# Patient Record
Sex: Female | Born: 1955 | Hispanic: Yes | Marital: Single | Smoking: Current some day smoker
Health system: Southern US, Community
[De-identification: ages and names within clinical notes are randomized; demographics above are authoritative.]

## PROBLEM LIST (undated history)

## (undated) DIAGNOSIS — R002 Palpitations: Secondary | ICD-10-CM

## (undated) DIAGNOSIS — E785 Hyperlipidemia, unspecified: Secondary | ICD-10-CM

## (undated) HISTORY — DX: Hyperlipidemia, unspecified: E78.5

## (undated) HISTORY — PX: TUBAL LIGATION: SHX77

---

## 2013-08-03 HISTORY — PX: FRACTURE SURGERY: SHX138

## 2013-08-16 ENCOUNTER — Emergency Department (HOSPITAL_COMMUNITY)
Admission: EM | Admit: 2013-08-16 | Discharge: 2013-08-16 | Disposition: A | Discharge status: Home / Self Care | Payer: Self-pay | Attending: Emergency Medicine | Admitting: Emergency Medicine

## 2013-08-16 ENCOUNTER — Encounter (HOSPITAL_COMMUNITY): Payer: Self-pay | Admitting: Emergency Medicine

## 2013-08-16 ENCOUNTER — Emergency Department (HOSPITAL_COMMUNITY): Payer: Self-pay

## 2013-08-16 DIAGNOSIS — W009XXA Unspecified fall due to ice and snow, initial encounter: Secondary | ICD-10-CM

## 2013-08-16 DIAGNOSIS — W010XXA Fall on same level from slipping, tripping and stumbling without subsequent striking against object, initial encounter: Secondary | ICD-10-CM | POA: Insufficient documentation

## 2013-08-16 DIAGNOSIS — Y929 Unspecified place or not applicable: Secondary | ICD-10-CM | POA: Insufficient documentation

## 2013-08-16 DIAGNOSIS — Y939 Activity, unspecified: Secondary | ICD-10-CM | POA: Insufficient documentation

## 2013-08-16 DIAGNOSIS — S52609A Unspecified fracture of lower end of unspecified ulna, initial encounter for closed fracture: Secondary | ICD-10-CM | POA: Insufficient documentation

## 2013-08-16 DIAGNOSIS — S52611A Displaced fracture of right ulna styloid process, initial encounter for closed fracture: Secondary | ICD-10-CM

## 2013-08-16 DIAGNOSIS — F17200 Nicotine dependence, unspecified, uncomplicated: Secondary | ICD-10-CM | POA: Insufficient documentation

## 2013-08-16 DIAGNOSIS — S52309A Unspecified fracture of shaft of unspecified radius, initial encounter for closed fracture: Secondary | ICD-10-CM | POA: Insufficient documentation

## 2013-08-16 DIAGNOSIS — Z79899 Other long term (current) drug therapy: Secondary | ICD-10-CM | POA: Insufficient documentation

## 2013-08-16 DIAGNOSIS — S52351A Displaced comminuted fracture of shaft of radius, right arm, initial encounter for closed fracture: Secondary | ICD-10-CM

## 2013-08-16 MED ORDER — OXYCODONE-ACETAMINOPHEN 5-325 MG PO TABS
2.0000 | ORAL_TABLET | Freq: Once | ORAL | Status: AC
  Administered 2013-08-16: 2 via ORAL
  Filled 2013-08-16: qty 2

## 2013-08-16 MED ORDER — OXYCODONE-ACETAMINOPHEN 5-325 MG PO TABS: 1.0000 | ORAL_TABLET | ORAL | Status: DC | PRN

## 2013-08-16 NOTE — Discharge Instructions (Signed)
Cuidados del yeso o la frula (Cast or Splint Care) El yeso y las frulas sostienen los miembros lesionados y evitan que los huesos se muevan hasta que se curen. Es importante que cuide el yeso o la frula cuando se encuentre en su casa.  INSTRUCCIONES PARA EL CUIDADO EN EL HOGAR  Mantenga el yeso o la frula al descubierto durante el tiempo de secado. Puede tardar Lyndal Pulley 24 y 11 horas para secarse si est hecho de yeso. La fibra de vidrio se seca en menos de 1 hora.  No apoye el yeso sobre nada que sea ms duro que una almohada durante 24 horas.  No aplique peso sobre el miembro lesionado ni haga presin sobre el yeso hasta que el mdico lo autorice.  Mantenga el yeso o la frula secos. Al mojarse pueden perder la forma y podra ocurrir que no soporten el [place]. Un yeso mojado que ha perdido su forma puede presionar de Geographical information systems officer peligrosa en la piel al secarse. Adems, la piel mojada podra infectarse.  Cubra el yeso o la frula con una bolsa plstica cuando tome un bao o cuando salga al exterior en das de lluvia o nieve. Si el yeso est colocado sobre el tronco, deber baarse pasando una esponja por el cuerpo, hasta que se lo retiren.  Si el yeso se moja, squelo con una toalla o con un secador de cabello slo en posicin de aire fro.  Mantenga el yeso o la frula limpios. Si el yeso se ensucia, puede limpiarlo con un pao hmedo.  No coloque objetos extraos duros o blandos debajo del yeso o cabestrillo, como algodn, papel higinico, locin o talco.  No se rasque la piel por debajo del molde con ningn objeto. Podra quedar adherido al yeso. Adems, el rascado puede causar una infeccin. Si siente picazn, use un secador de cabello con aire fro [employer] zona que pica para [employer].  No recorte ni quite el relleno acolchado que se encuentra debajo del yeso.  Ejercite todas las articulaciones que no estn inmovilizadas por el yeso o frula. Por ejemplo, si tiene un yeso  largo de pierna, ejercite la articulacin de la cadera y los dedos de los pies. Si tiene un brazo [employer] o entablillado, ejercite el hombro, el codo, el pulgar y los dedos de la [place].  Eleve el brazo o la pierna sobre 1  2 almohadas durante los primeros 3 das para disminuir la hinchazn y Conservation officer, historic buildings.Es mejor si puede elevar cmodamente el yeso para que quede ms [place] del nivel del corazn. SOLICITE ATENCIN MDICA SI:   El yeso o la frula se quiebran.  Siente que el yeso o la frula estn muy apretados o muy flojos.  Tiene una picazn insoportable debajo del yeso.  El yeso se moja o tiene una zona blanda.  Siente un feo [employer] proviene del interior del [place].  Algn objeto se queda atascado bajo el yeso.  La piel que rodea el yeso enrojece o se vuelve sensible.  Siente un dolor nuevo o el dolor que senta empeora luego de la aplicacin del yeso. SOLICITE ATENCIN MDICA DE INMEDIATO SI:   Observa un lquido que sale por el yeso.  No puede mover el dedo lesionado.  Los dedos le cambian de color (blancos o azules), siente fro, Social research officer, government o por fuera del yeso los dedos estn muy inflamados.  Siente hormigueo o adormecimiento alrededor de la zona de la lesin.  Siente un dolor o presin intensos debajo del yeso.  Presenta dificultad para respirar o Risk manager.  Siente dolor en el pecho. Document Released: 07/20/2005 Document Revised: 05/10/2013 [facility] Patient Information 2014 [place], [place].

## 2013-08-16 NOTE — Progress Notes (Signed)
Orthopedic Tech Progress Note Patient Details:  Allison Baker [birth year] [id number]  Ortho Devices Type of Ortho Device: Ace wrap;Arm sling;Sugartong splint Ortho Device/Splint Location: rue Ortho Device/Splint Interventions: Application   [name] 08/16/2013, 2:43 PM

## 2013-08-16 NOTE — ED Provider Notes (Signed)
Medical screening examination/treatment/procedure(s) were performed by non-physician practitioner and as supervising physician I was immediately available for consultation/collaboration.  EKG Interpretation   None        Merryl Hacker, MD 08/16/13 2003

## 2013-08-16 NOTE — ED Notes (Signed)
[place] this am and hurt rt wrist swollen and painful has good pulse cooler than left can wiggle fingers

## 2013-08-16 NOTE — ED Notes (Signed)
Phone interpretation services called (Spanish) and discharge instructions explained through interpretation as well as medication instructions given and follow up/interpreter stated she understood.

## 2013-08-16 NOTE — ED Provider Notes (Signed)
CSN: [id number]     Arrival date & time 08/16/13  1020 History  This chart was scribed for non-physician practitioner Noland Fordyce, PA-C working with Merryl Hacker, MD by Rolanda Lundborg, ED Scribe. This patient was seen in room TR08C/TR08C and the patient's care was started at 10:28 AM.    Chief Complaint  Patient presents with  . Wrist Pain   The history is provided by the patient and a relative. The history is limited by a language barrier. A language interpreter was used.   HPI Comments: Allison Baker is a 58 y.o. female who presents to the Emergency Department complaining of constant, aching, right wrist pain since slipping and falling on icy stairs and landing on her rand hand. She states she put her hand out to catch herself. She rates the severity of the pain as a 4/10. She denies elbow or shoulder pain. She denies hitting her head or LOC. She is right handed. Pt reports mild numbness and tingling but she is able to move her fingers. She is otherwise healthy. She has no allergies to medications.  History reviewed. No pertinent past medical history. History reviewed. No pertinent past surgical history. No family history on file. History  Substance Use Topics  . Smoking status: Current Every Day Smoker  . Smokeless tobacco: Not on file  . Alcohol Use: No   OB History   Grav Para Term Preterm Abortions TAB SAB Ect Mult Living                 Review of Systems  Musculoskeletal: Positive for arthralgias (right wrist).  Neurological: Positive for numbness. Negative for syncope.  All other systems reviewed and are negative.    Allergies  Review of patient's allergies indicates no known allergies.  Home Medications   Current Outpatient Rx  Name  Route  Sig  Dispense  Refill  . CALCIUM PO   Oral   Take 1 tablet by mouth daily.         . Multiple Vitamin (MULTIVITAMIN WITH MINERALS) TABS tablet   Oral   Take 1 tablet by mouth daily.         Marland Kitchen oxyCODONE-acetaminophen  (PERCOCET/ROXICET) 5-325 MG per tablet   Oral   Take 1-2 tablets by mouth every 4 (four) hours as needed for moderate pain or severe pain.   10 tablet   0    Pulse 64  Temp(Src) 99.2 F (37.3 C)  Resp 16  SpO2 100% Physical Exam  Nursing note and vitals reviewed. Constitutional: She appears well-developed and well-nourished. No distress.  HENT:  Head: Normocephalic and atraumatic.  Eyes: Conjunctivae are normal. No scleral icterus.  Neck: Normal range of motion.  Cardiovascular: Normal rate, regular rhythm and normal heart sounds.   Pulmonary/Chest: Effort normal and breath sounds normal. No respiratory distress. She has no wheezes. She has no rales. She exhibits no tenderness.  Abdominal: Soft.  Musculoskeletal: Normal range of motion.  Right wrist mild to moderate edema obvious deformity, minimal ecchymosis on volar aspect with superficial abrasion. Significant ttp. radial pulse 2+ and cap refill less than 2 seconds. Limited wrist flexion and extension due to pain. 3/5 grip strength.  Neurological: She is alert.  Skin: Skin is warm and dry. She is not diaphoretic.    ED Course  Procedures (including critical care time) Medications - No data to display  DIAGNOSTIC STUDIES: Oxygen Saturation is 100% on RA, normal by my interpretation.    COORDINATION OF CARE: 11:09 AM-  Discussed treatment plan with pt which includes consulting with hand surgeon. Pt agrees to plan.    Labs Review Labs Reviewed - No data to display Imaging Review Dg Forearm Right  08/16/2013   CLINICAL DATA:  58 year old female status post fall with pain. Initial encounter.  EXAM: RIGHT FOREARM - 2 VIEW  COMPARISON:  None.  FINDINGS: Comminuted distal right radius fracture with impaction, dorsal displacement and angulation, and lateral displacement and angulation. Radiocarpal joint involvement. The more proximal right radius appears intact. The right ulna appears intact. Grossly normal alignment at the right  elbow with no evidence of elbow joint effusion.  IMPRESSION: Comminuted, impacted, and displaced distal right radius fracture.   Electronically Signed   By: Lars Pinks M.D.   On: 08/16/2013 10:59   Dg Wrist Complete Right  08/16/2013   CLINICAL DATA:  58 year old female status post fall with pain.  EXAM: RIGHT WRIST - COMPLETE 3+ VIEW  COMPARISON:  Right forearm series from the same day reported separately.  FINDINGS: Comminuted and impacted distal right radius fracture with radiocarpal joint and DRU involvement. Butterfly fragments are dorsally and laterally displaced and impacted. Dorsal angulation.  Minimally displaced ulnar styloid fracture.  Carpal bone alignment within normal limits. Scaphoid intact. Proximal metacarpals intact.  IMPRESSION: Comminuted and impacted distal right radius fracture with radiocarpal joint and DRU involvement. Ulnar styloid fracture.   Electronically Signed   By: Lars Pinks M.D.   On: 08/16/2013 11:01    EKG Interpretation   None       MDM   1. Fall due to ice or snow   2. Closed displaced comminuted fracture of shaft of right radius   3. Fracture of right ulnar styloid    Pt c/o sudden onset right wrist pain after fall on steps this morning. Obvious deformity.  Pt declined pain medication in ED.    IMPRESSION: Comminuted, impacted, and displaced distal right radius fracture.   Consulted with Dr. Caralyn Guile, will splint in long arm sugar tong splint and place in sling. Pt is to f/u with Dr. Caralyn Guile in the office on Friday, 08/19/15. Rx: percocet.  Return precautions provided. Pt verbalized understanding and agreement with tx plan.  I personally performed the services described in this documentation, which was scribed in my presence. The recorded information has been reviewed and is accurate.   Noland Fordyce, PA-C 08/16/13 1318

## 2013-08-22 ENCOUNTER — Encounter (HOSPITAL_COMMUNITY)
Admission: RE | Admit: 2013-08-22 | Discharge: 2013-08-22 | Disposition: A | Discharge status: Home / Self Care | Payer: Self-pay | Source: Ambulatory Visit | Attending: Orthopedic Surgery | Admitting: Orthopedic Surgery

## 2013-08-22 ENCOUNTER — Encounter (HOSPITAL_COMMUNITY): Payer: Self-pay

## 2013-08-22 DIAGNOSIS — Z01812 Encounter for preprocedural laboratory examination: Secondary | ICD-10-CM | POA: Insufficient documentation

## 2013-08-22 DIAGNOSIS — Z01818 Encounter for other preprocedural examination: Secondary | ICD-10-CM | POA: Insufficient documentation

## 2013-08-22 DIAGNOSIS — Z01810 Encounter for preprocedural cardiovascular examination: Secondary | ICD-10-CM | POA: Insufficient documentation

## 2013-08-22 HISTORY — DX: Palpitations: R00.2

## 2013-08-22 LAB — BASIC METABOLIC PANEL
BUN: 13 mg/dL (ref 6–23)
CO2: 24 mEq/L (ref 19–32)
Calcium: 9.3 mg/dL (ref 8.4–10.5)
Chloride: 103 mEq/L (ref 96–112)
Creatinine, Ser: 0.68 mg/dL (ref 0.50–1.10)
GFR calc Af Amer: >90 mL/min (ref >90)
GFR calc non Af Amer: >90 mL/min (ref >90)
Glucose, Bld: 79 mg/dL (ref 70–99)
Potassium: 4.2 mEq/L (ref 3.7–5.3)
Sodium: 141 mEq/L (ref 137–147)

## 2013-08-22 LAB — CBC
HCT: 37.3 % (ref 36.0–46.0)
Hemoglobin: 12.6 g/dL (ref 12.0–15.0)
MCH: 31 pg (ref 26.0–34.0)
MCHC: 33.8 g/dL (ref 30.0–36.0)
MCV: 91.9 fL (ref 78.0–100.0)
Platelets: 328 10*3/uL (ref 150–400)
RBC: 4.06 MIL/uL (ref 3.87–5.11)
RDW: 13 % (ref 11.5–15.5)
WBC: 8.9 10*3/uL (ref 4.0–10.5)

## 2013-08-22 NOTE — Progress Notes (Signed)
Denies having a PCP Denies seeing a cardiologist. Denies having a recent CXR or EKG. Denies having an echo, stress test, or card cath. Reports some palpations when her father was sick, no problems since. .  EKG ordered to assess.

## 2013-08-22 NOTE — Pre-Procedure Instructions (Signed)
Josefita Weissmann  08/22/2013   Your procedure is scheduled on:  Jan 22 at 330pm  Report to [facility]  2 * 3 at 130pm  Call this number if you have problems the morning of surgery: [phone]   Remember:   Do not eat food or drink liquids after midnight.   Take these medicines the morning of surgery with A SIP OF WATER: Pain pill if needed Percocet (Oxycodone)  Stop taking Aspirin, Aleve, Ibuprofen, BC's, Goody's, Herbal medications, and Fish Oil   Do not wear jewelry, make-up or nail polish.  Do not wear lotions, powders, or perfumes. You may wear deodorant.  Do not shave 48 hours prior to surgery.   Do not bring valuables to the hospital.  [facility] is not responsible                  for any belongings or valuables.               Contacts, dentures or bridgework may not be worn into surgery.  Leave suitcase in the car. After surgery it may be brought to your room.  For patients admitted to the hospital, discharge time is determined by your                treatment team.               Patients discharged the day of surgery will not be allowed to drive  home.    Special Instructions: Shower using CHG 2 nights before surgery and the night before surgery.  If you shower the day of surgery use CHG.  Use special wash - you have one bottle of CHG for all showers.  You should use approximately 1/3 of the bottle for each shower.   Please read over the following fact sheets that you were given: Pain Booklet, Coughing and Deep Breathing and Surgical Site Infection Prevention

## 2013-08-22 NOTE — Progress Notes (Signed)
Spoke with Baker Janus, about pt being spanish speaking.  Baker Janus will set up interpretor for the DOS.

## 2013-08-23 NOTE — H&P (Signed)
Allison Baker is an 58 y.o. female.   Chief Complaint: RIGHT WRIST PAIN HPI: PT WITH FALL ON RIGHT WRIST PT FOLLOWED IN OFFICE PT HERE FOR SURGERY TO CORRECT POSITION OF RIGHT WRIST PT WITH NO PRIOR SURGERY  Past Medical History  Diagnosis Date  . Palpitations     Past Surgical History  Procedure Laterality Date  . Tubal ligation      No family history on file. Social History:  reports that she has been smoking.  She does not have any smokeless tobacco history on file. She reports that she does not drink alcohol. Her drug history is not on file.  Allergies: No Known Allergies  No prescriptions prior to admission    Results for orders placed during the hospital encounter of 08/22/13 (from the past 48 hour(s))  BASIC METABOLIC PANEL     Status: None   Collection Time    08/22/13 12:44 PM      Result Value Range   Sodium 141  137 - 147 mEq/L   Potassium 4.2  3.7 - 5.3 mEq/L   Chloride 103  96 - 112 mEq/L   CO2 24  19 - 32 mEq/L   Glucose, Bld 79  70 - 99 mg/dL   BUN 13  6 - 23 mg/dL   Creatinine, Ser 0.68  0.50 - 1.10 mg/dL   Calcium 9.3  8.4 - 10.5 mg/dL   GFR calc non Af Amer >90  >90 mL/min   GFR calc Af Amer >90  >90 mL/min   Comment: (NOTE)     The eGFR has been calculated using the CKD EPI equation.     This calculation has not been validated in all clinical situations.     eGFR's persistently <90 mL/min signify possible Chronic Kidney     Disease.  CBC     Status: None   Collection Time    08/22/13 12:44 PM      Result Value Range   WBC 8.9  4.0 - 10.5 K/uL   RBC 4.06  3.87 - 5.11 MIL/uL   Hemoglobin 12.6  12.0 - 15.0 g/dL   HCT 37.3  36.0 - 46.0 %   MCV 91.9  78.0 - 100.0 fL   MCH 31.0  26.0 - 34.0 pg   MCHC 33.8  30.0 - 36.0 g/dL   RDW 13.0  11.5 - 15.5 %   Platelets 328  150 - 400 K/uL   No results found.  ROS NO RECENT ILLNESSES OR HOSPITALIZATIONS  There were no vitals taken for this visit. Physical Exam   General Appearance:  Alert, cooperative,  no distress, appears stated age  Head:  Normocephalic, without obvious abnormality, atraumatic  Eyes:  Pupils equal, conjunctiva/corneas clear,         Throat: Lips, mucosa, and tongue normal; teeth and gums normal  Neck: No visible masses     Lungs:   respirations unlabored  Chest Wall:  No tenderness or deformity  Heart:  Regular rate and rhythm,  Abdomen:   Soft, non-tender,         Extremities: RIGHT WRIST: SPLINT CLEAN AND DRY WIGGLES FINGERS FINGERS WARM WELL PERFUSED LIMITED FOREARM MOBILITY ABLE TO EXTEND THUMB AND FINGERS  Pulses: 2+ and symmetric  Skin: Skin color, texture, turgor normal, no rashes or lesions     Neurologic: Normal    Assessment/Plan RIGHT WRIST COMMINUTED DISTAL RADIUS FRACTURE, INTRAARTICULAR DISPLACED  RIGHT WRIST OPEN REDUCTION AND INTERNAL FIXATION AND REPAIR AS INDICATED  R/B/A  DISCUSSED WITH PT IN OFFICE.  PT VOICED UNDERSTANDING OF PLAN CONSENT SIGNED DAY OF SURGERY PT SEEN AND EXAMINED PRIOR TO OPERATIVE PROCEDURE/DAY OF SURGERY SITE MARKED. QUESTIONS ANSWERED WILL REMAIN OVERNIGHT OBSERVATION FOLLOWING SURGERY  Linna Hoff 08/24/2013 AT 9340

## 2013-08-23 NOTE — Progress Notes (Signed)
No orders in epic, called Dr.Ortmann's office to inform them.

## 2013-08-24 ENCOUNTER — Encounter (HOSPITAL_COMMUNITY)
Admission: RE | Disposition: A | Discharge status: Home / Self Care | Payer: Self-pay | Source: Ambulatory Visit | Attending: Orthopedic Surgery

## 2013-08-24 ENCOUNTER — Ambulatory Visit (HOSPITAL_COMMUNITY): Payer: Self-pay | Admitting: Anesthesiology

## 2013-08-24 ENCOUNTER — Encounter (HOSPITAL_COMMUNITY): Payer: Self-pay | Admitting: Anesthesiology

## 2013-08-24 ENCOUNTER — Ambulatory Visit (HOSPITAL_COMMUNITY)
Admission: RE | Admit: 2013-08-24 | Discharge: 2013-08-24 | Disposition: A | Discharge status: Home / Self Care | Payer: Self-pay | Source: Ambulatory Visit | Attending: Orthopedic Surgery | Admitting: Orthopedic Surgery

## 2013-08-24 DIAGNOSIS — I209 Angina pectoris, unspecified: Secondary | ICD-10-CM | POA: Insufficient documentation

## 2013-08-24 DIAGNOSIS — I2510 Atherosclerotic heart disease of native coronary artery without angina pectoris: Secondary | ICD-10-CM | POA: Insufficient documentation

## 2013-08-24 DIAGNOSIS — S52539A Colles' fracture of unspecified radius, initial encounter for closed fracture: Secondary | ICD-10-CM | POA: Insufficient documentation

## 2013-08-24 DIAGNOSIS — F17200 Nicotine dependence, unspecified, uncomplicated: Secondary | ICD-10-CM | POA: Insufficient documentation

## 2013-08-24 DIAGNOSIS — R002 Palpitations: Secondary | ICD-10-CM | POA: Insufficient documentation

## 2013-08-24 DIAGNOSIS — W19XXXA Unspecified fall, initial encounter: Secondary | ICD-10-CM | POA: Insufficient documentation

## 2013-08-24 HISTORY — PX: ORIF WRIST FRACTURE: SHX2133

## 2013-08-24 SURGERY — OPEN REDUCTION INTERNAL FIXATION (ORIF) WRIST FRACTURE
Anesthesia: Regional | Site: Wrist | Laterality: Right

## 2013-08-24 MED ORDER — OXYCODONE HCL 5 MG PO TABS: 5.0000 mg | ORAL_TABLET | Freq: Once | ORAL | Status: DC | PRN

## 2013-08-24 MED ORDER — DOCUSATE SODIUM 100 MG PO CAPS: 100.0000 mg | ORAL_CAPSULE | Freq: Two times a day (BID) | ORAL | Status: DC

## 2013-08-24 MED ORDER — FENTANYL CITRATE 0.05 MG/ML IJ SOLN
INTRAMUSCULAR | Status: AC
  Filled 2013-08-24: qty 5

## 2013-08-24 MED ORDER — CEFAZOLIN SODIUM-DEXTROSE 2-3 GM-% IV SOLR
INTRAVENOUS | Status: AC
  Administered 2013-08-24: 2 g via INTRAVENOUS
  Filled 2013-08-24: qty 50

## 2013-08-24 MED ORDER — MIDAZOLAM HCL 2 MG/2ML IJ SOLN
INTRAMUSCULAR | Status: AC
  Administered 2013-08-24: 2 mg
  Filled 2013-08-24: qty 2

## 2013-08-24 MED ORDER — MIDAZOLAM HCL 2 MG/2ML IJ SOLN
INTRAMUSCULAR | Status: AC
  Filled 2013-08-24: qty 2

## 2013-08-24 MED ORDER — OXYCODONE-ACETAMINOPHEN 10-325 MG PO TABS
1.0000 | ORAL_TABLET | ORAL | Status: DC | PRN
Start: 2013-08-24 — End: 2015-10-17

## 2013-08-24 MED ORDER — ACETAMINOPHEN 160 MG/5ML PO SOLN: 325.0000 mg | ORAL | Status: DC | PRN

## 2013-08-24 MED ORDER — OXYCODONE HCL 5 MG/5ML PO SOLN: 5.0000 mg | Freq: Once | ORAL | Status: DC | PRN

## 2013-08-24 MED ORDER — 0.9 % SODIUM CHLORIDE (POUR BTL) OPTIME
TOPICAL | Status: DC | PRN
  Administered 2013-08-24: 1000 mL

## 2013-08-24 MED ORDER — PROPOFOL 10 MG/ML IV BOLUS
INTRAVENOUS | Status: AC
  Filled 2013-08-24: qty 20

## 2013-08-24 MED ORDER — ONDANSETRON HCL 4 MG/2ML IJ SOLN: 4.0000 mg | Freq: Once | INTRAMUSCULAR | Status: DC | PRN

## 2013-08-24 MED ORDER — LACTATED RINGERS IV SOLN
INTRAVENOUS | Status: DC
  Administered 2013-08-24: 15:00:00 via INTRAVENOUS

## 2013-08-24 MED ORDER — CHLORHEXIDINE GLUCONATE 4 % EX LIQD: 60.0000 mL | Freq: Once | CUTANEOUS | Status: DC

## 2013-08-24 MED ORDER — LACTATED RINGERS IV SOLN
INTRAVENOUS | Status: DC | PRN
  Administered 2013-08-24: 15:00:00 via INTRAVENOUS

## 2013-08-24 MED ORDER — LIDOCAINE HCL (CARDIAC) 20 MG/ML IV SOLN
INTRAVENOUS | Status: AC
  Filled 2013-08-24: qty 5

## 2013-08-24 MED ORDER — FENTANYL CITRATE 0.05 MG/ML IJ SOLN
INTRAMUSCULAR | Status: DC | PRN
  Administered 2013-08-24: 25 ug via INTRAVENOUS
  Administered 2013-08-24 (×3): 50 ug via INTRAVENOUS
  Administered 2013-08-24: 25 ug via INTRAVENOUS
  Administered 2013-08-24: 50 ug via INTRAVENOUS

## 2013-08-24 MED ORDER — VITAMIN C 500 MG PO TABS: 500.0000 mg | ORAL_TABLET | Freq: Every day | ORAL | Status: DC

## 2013-08-24 MED ORDER — HYDROMORPHONE HCL PF 1 MG/ML IJ SOLN: 0.2500 mg | INTRAMUSCULAR | Status: DC | PRN

## 2013-08-24 MED ORDER — LIDOCAINE HCL (CARDIAC) 20 MG/ML IV SOLN
INTRAVENOUS | Status: DC | PRN
  Administered 2013-08-24: 30 mg via INTRAVENOUS

## 2013-08-24 MED ORDER — ROCURONIUM BROMIDE 50 MG/5ML IV SOLN
INTRAVENOUS | Status: AC
  Filled 2013-08-24: qty 1

## 2013-08-24 MED ORDER — ROPIVACAINE HCL 5 MG/ML IJ SOLN
INTRAMUSCULAR | Status: DC | PRN
  Administered 2013-08-24: 20 mL via PERINEURAL

## 2013-08-24 MED ORDER — PROPOFOL 10 MG/ML IV BOLUS
INTRAVENOUS | Status: DC | PRN
  Administered 2013-08-24: 20 mg via INTRAVENOUS
  Administered 2013-08-24 (×2): 30 mg via INTRAVENOUS
  Administered 2013-08-24: 20 mg via INTRAVENOUS
  Administered 2013-08-24: 30 mg via INTRAVENOUS

## 2013-08-24 MED ORDER — CEFAZOLIN SODIUM-DEXTROSE 2-3 GM-% IV SOLR: 2.0000 g | INTRAVENOUS | Status: DC

## 2013-08-24 MED ORDER — MIDAZOLAM HCL 5 MG/5ML IJ SOLN
INTRAMUSCULAR | Status: DC | PRN
  Administered 2013-08-24 (×2): 1 mg via INTRAVENOUS

## 2013-08-24 MED ORDER — BUPIVACAINE HCL (PF) 0.25 % IJ SOLN
INTRAMUSCULAR | Status: AC
  Filled 2013-08-24: qty 30

## 2013-08-24 MED ORDER — FENTANYL CITRATE 0.05 MG/ML IJ SOLN
INTRAMUSCULAR | Status: AC
  Filled 2013-08-24: qty 2

## 2013-08-24 MED ORDER — ACETAMINOPHEN 325 MG PO TABS: 325.0000 mg | ORAL_TABLET | ORAL | Status: DC | PRN

## 2013-08-24 MED ORDER — METHOCARBAMOL 500 MG PO TABS: 500.0000 mg | ORAL_TABLET | Freq: Four times a day (QID) | ORAL | Status: DC

## 2013-08-24 SURGICAL SUPPLY — 54 items
BANDAGE ELASTIC 3 VELCRO ST LF (GAUZE/BANDAGES/DRESSINGS) ×2 IMPLANT
BANDAGE ELASTIC 4 VELCRO ST LF (GAUZE/BANDAGES/DRESSINGS) ×2 IMPLANT
BANDAGE GAUZE ELAST BULKY 4 IN (GAUZE/BANDAGES/DRESSINGS) IMPLANT
BIT DRILL 2.2 SS TIBIAL (BIT) ×2 IMPLANT
BLADE SURG ROTATE 9660 (MISCELLANEOUS) IMPLANT
BNDG ESMARK 4X9 LF (GAUZE/BANDAGES/DRESSINGS) ×2 IMPLANT
BNDG GAUZE ELAST 4 BULKY (GAUZE/BANDAGES/DRESSINGS) ×2 IMPLANT
CLOTH BEACON ORANGE TIMEOUT ST (SAFETY) IMPLANT
CORDS BIPOLAR (ELECTRODE) ×2 IMPLANT
COVER SURGICAL LIGHT HANDLE (MISCELLANEOUS) ×2 IMPLANT
CUFF TOURNIQUET SINGLE 18IN (TOURNIQUET CUFF) ×2 IMPLANT
CUFF TOURNIQUET SINGLE 24IN (TOURNIQUET CUFF) IMPLANT
DRAIN TLS ROUND 10FR (DRAIN) IMPLANT
DRAPE OEC MINIVIEW 54X84 (DRAPES) ×2 IMPLANT
DRAPE SURG 17X11 SM STRL (DRAPES) ×2 IMPLANT
DRSG ADAPTIC 3X8 NADH LF (GAUZE/BANDAGES/DRESSINGS) ×2 IMPLANT
ELECT REM PT RETURN 9FT ADLT (ELECTROSURGICAL)
ELECTRODE REM PT RTRN 9FT ADLT (ELECTROSURGICAL) IMPLANT
GLOVE BIOGEL PI IND STRL 8.5 (GLOVE) ×1 IMPLANT
GLOVE BIOGEL PI INDICATOR 8.5 (GLOVE) ×1
GLOVE SURG ORTHO 8.0 STRL STRW (GLOVE) ×2 IMPLANT
GOWN PREVENTION PLUS XLARGE (GOWN DISPOSABLE) ×2 IMPLANT
GOWN STRL NON-REIN LRG LVL3 (GOWN DISPOSABLE) ×2 IMPLANT
KIT BASIN OR (CUSTOM PROCEDURE TRAY) ×2 IMPLANT
KIT ROOM TURNOVER OR (KITS) ×2 IMPLANT
MANIFOLD NEPTUNE II (INSTRUMENTS) IMPLANT
NEEDLE HYPO 25X1 1.5 SAFETY (NEEDLE) ×2 IMPLANT
NS IRRIG 1000ML POUR BTL (IV SOLUTION) ×2 IMPLANT
PACK ORTHO EXTREMITY (CUSTOM PROCEDURE TRAY) ×2 IMPLANT
PAD ARMBOARD 7.5X6 YLW CONV (MISCELLANEOUS) ×4 IMPLANT
PAD CAST 4YDX4 CTTN HI CHSV (CAST SUPPLIES) ×1 IMPLANT
PADDING CAST COTTON 4X4 STRL (CAST SUPPLIES) ×1
PEG LOCKING SMOOTH 2.2X18 (Peg) ×8 IMPLANT
PEG LOCKING SMOOTH 2.2X20 (Screw) ×2 IMPLANT
PLATE NARROW DVR RIGHT (Plate) ×2 IMPLANT
PUTTY DBM STAGRAFT PLUS 5CC (Putty) ×2 IMPLANT
SCREW LOCK 14X2.7X 3 LD TPR (Screw) ×4 IMPLANT
SCREW LOCK 18X2.7X 3 LD TPR (Screw) ×1 IMPLANT
SCREW LOCKING 2.7X14 (Screw) ×4 IMPLANT
SCREW LOCKING 2.7X15MM (Screw) ×2 IMPLANT
SCREW LOCKING 2.7X18 (Screw) ×1 IMPLANT
SOAP 2 % CHG 4 OZ (WOUND CARE) ×2 IMPLANT
SPLINT FIBERGLASS 4X30 (CAST SUPPLIES) ×2 IMPLANT
SPONGE GAUZE 4X4 12PLY (GAUZE/BANDAGES/DRESSINGS) ×2 IMPLANT
STRIP CLOSURE SKIN 1/2X4 (GAUZE/BANDAGES/DRESSINGS) ×2 IMPLANT
SUT PROLENE 4 0 PS 2 18 (SUTURE) ×2 IMPLANT
SUT VIC AB 2-0 FS1 27 (SUTURE) ×2 IMPLANT
SUT VICRYL 4-0 PS2 18IN ABS (SUTURE) ×4 IMPLANT
SYR CONTROL 10ML LL (SYRINGE) IMPLANT
SYSTEM CHEST DRAIN TLS 7FR (DRAIN) IMPLANT
TOWEL OR 17X24 6PK STRL BLUE (TOWEL DISPOSABLE) ×2 IMPLANT
TOWEL OR 17X26 10 PK STRL BLUE (TOWEL DISPOSABLE) ×2 IMPLANT
TUBE CONNECTING 12X1/4 (SUCTIONS) ×2 IMPLANT
WATER STERILE IRR 1000ML POUR (IV SOLUTION) IMPLANT

## 2013-08-24 NOTE — Progress Notes (Signed)
Orthopedic Tech Progress Note Patient Details:  Allison Baker 1956/03/28 [id number]  Ortho Devices Type of Ortho Device: Arm sling Ortho Device/Splint Location: RUE Ortho Device/Splint Interventions: Ordered;Application   Braulio Bosch 08/24/2013, 5:14 PM

## 2013-08-24 NOTE — Progress Notes (Signed)
Interpreter at bedside.

## 2013-08-24 NOTE — Anesthesia Procedure Notes (Signed)
Anesthesia Regional Block:  Supraclavicular block  Pre-Anesthetic Checklist: ,, timeout performed, Correct Patient, Correct Site, Correct Laterality, Correct Procedure, Correct Position, site marked, Risks and benefits discussed,  Surgical consent,  Pre-op evaluation,  At surgeon's request and post-op pain management  Laterality: Right  Prep: chloraprep       Needles:  Injection technique: Single-shot  Needle Type: Stimiplex          Additional Needles:  Procedures: ultrasound guided (picture in chart) Supraclavicular block Narrative:  Start time: 08/24/2013 2:51 PM End time: 08/24/2013 2:58 PM Injection made incrementally with aspirations every 5 mL.  Performed by: Personally  Anesthesiologist: Ermalene Postin

## 2013-08-24 NOTE — Brief Op Note (Signed)
08/24/2013  2:43 PM  PATIENT:  Allison Baker  58 y.o. female  PRE-OPERATIVE DIAGNOSIS:  Right Wrist Distal Radius Fracture  POST-OPERATIVE DIAGNOSIS:  same  PROCEDURE:  Procedure(s): OPEN REDUCTION INTERNAL FIXATION (ORIF) RIGHT WRIST FRACTURE (Right)  SURGEON:  Surgeon(s) and Role:    * Linna Hoff, MD - Primary  PHYSICIAN ASSISTANT:   ASSISTANTS: none   ANESTHESIA:   general  EBL:     BLOOD ADMINISTERED:none  DRAINS: none   LOCAL MEDICATIONS USED:  MARCAINE     SPECIMEN:  No Specimen  DISPOSITION OF SPECIMEN:  N/A  COUNTS:  YES  TOURNIQUET:    DICTATION: [id number]  PLAN OF CARE: discharge to home  PATIENT DISPOSITION:  PACU - hemodynamically stable.   Delay start of Pharmacological VTE agent (>24hrs) due to surgical blood loss or risk of bleeding: not applicable

## 2013-08-24 NOTE — Transfer of Care (Signed)
Immediate Anesthesia Transfer of Care Note  Patient: Allison Baker  Procedure(s) Performed: Procedure(s): OPEN REDUCTION INTERNAL FIXATION (ORIF) RIGHT WRIST FRACTURE (Right)  Patient Location: PACU  Anesthesia Type:MAC and MAC combined with regional for post-op pain  Level of Consciousness: awake, alert  and oriented  Airway & Oxygen Therapy: Patient Spontanous Breathing  Post-op Assessment: Report given to PACU RN and Post -op Vital signs reviewed and stable  Post vital signs: Reviewed and stable  Complications: No apparent anesthesia complications

## 2013-08-24 NOTE — Discharge Instructions (Signed)
KEEP BANDAGE CLEAN AND DRY CALL OFFICE FOR F/U APPT [phone] IN 14 DAYS KEEP HAND ELEVATED ABOVE HEART OK TO APPLY ICE TO OPERATIVE AREA CONTACT OFFICE IF ANY WORSENING PAIN OR CONCERNS. 

## 2013-08-24 NOTE — Anesthesia Preprocedure Evaluation (Addendum)
Anesthesia Evaluation  Patient identified by MRN, date of birth, ID band Patient awake    Reviewed: Allergy & Precautions, H&P , NPO status , Patient's Chart, lab work & pertinent test results  History of Anesthesia Complications Negative for: history of anesthetic complications  Airway Mallampati: II TM Distance: >3 FB Neck ROM: Full    Dental  (+) Teeth Intact and Dental Advisory Given   Pulmonary neg shortness of breath, neg COPDCurrent Smoker,    Pulmonary exam normal       Cardiovascular Exercise Tolerance: Good METS: 5 - 7 Mets - angina- CAD and - DOE negative cardio ROS  Rhythm:Regular Rate:Normal     Neuro/Psych negative neurological ROS     GI/Hepatic negative GI ROS, Neg liver ROS,   Endo/Other  negative endocrine ROS  Renal/GU negative Renal ROS  negative genitourinary   Musculoskeletal   Abdominal   Peds  Hematology negative hematology ROS (+)   Anesthesia Other Findings   Reproductive/Obstetrics                          Anesthesia Physical Anesthesia Plan  ASA: II  Anesthesia Plan: Regional   Post-op Pain Management: MAC Combined w/ Regional for Post-op pain   Induction: Intravenous  Airway Management Planned: Mask  Additional Equipment: None  Intra-op Plan:   Post-operative Plan: Extubation in OR  Informed Consent: I have reviewed the patients History and Physical, chart, labs and discussed the procedure including the risks, benefits and alternatives for the proposed anesthesia with the patient or authorized representative who has indicated his/her understanding and acceptance.   Dental advisory given  Plan Discussed with: CRNA, Surgeon and Anesthesiologist  Anesthesia Plan Comments:        Anesthesia Quick Evaluation

## 2013-08-25 NOTE — Anesthesia Postprocedure Evaluation (Signed)
  Anesthesia Post-op Note  Patient: Allison Baker  Procedure(s) Performed: Procedure(s): OPEN REDUCTION INTERNAL FIXATION (ORIF) RIGHT WRIST FRACTURE (Right)  Patient Location: PACU  Anesthesia Type:MAC and Regional  Level of Consciousness: awake, alert  and oriented  Airway and Oxygen Therapy: Patient Spontanous Breathing  Post-op Pain: none  Post-op Assessment: Post-op Vital signs reviewed, Patient's Cardiovascular Status Stable, Respiratory Function Stable, Patent Airway, No signs of Nausea or vomiting and Pain level controlled  Post-op Vital Signs: Reviewed and stable  Complications: No apparent anesthesia complications

## 2013-08-25 NOTE — Op Note (Signed)
NAMEADLYN, Allison Baker                  ACCOUNT NO.:  [id number]  MEDICAL RECORD NO.:  [id number]  LOCATION:  [facility]                         FACILITY:  [facility]  PHYSICIAN:  Melrose Nakayama, MD  DATE OF BIRTH:  Dec 26, 1955  DATE OF PROCEDURE:  08/24/2013 DATE OF DISCHARGE:  08/24/2013                              OPERATIVE REPORT   PREOPERATIVE DIAGNOSIS:  Right wrist intra-articular distal radius fracture 3 or more fragments.  POSTOPERATIVE DIAGNOSIS:  Right wrist intra-articular distal radius fracture 3 or more fragments.  ATTENDING PHYSICIAN:  Linna Hoff IV, MD, who was scrubbed and present for the entire procedure.  ASSISTANT SURGEON:  None.  ANESTHESIA:  Supraclavicular block performed by anesthesia and IV sedation.  SURGICAL PROCEDURE: 1. Open treatment of right wrist intra-articular distal radius     fracture 3 or more fragments. 2. Right wrist brachioradialis tendon and tenotomy and lengthening. 3. Right wrist radiographs 3 views.  SURGICAL INDICATIONS:  Allison Baker is a 58 year old right-hand-dominant female, who slipped and fell last week sustaining a highly comminuted intra-articular distal radius fracture.  The patient was seen and evaluated in the office and given the degree of displacement, recommended to undergo the above procedure.  Risks, benefits, and alternatives were discussed in detail with the patient and signed informed consent was obtained.  Risks include, but not limited to bleeding, infection, damage to nearby nerves, arteries, or tendons, loss of motion wrist and digits, incomplete relief of symptoms, and need for further surgical intervention.  DESCRIPTION OF PROCEDURE:  The patient was properly identified in the preoperative holding area and marked with a permanent marker made on the right wrist to indicate the correct operative site.  The patient was then brought back to the operating room, placed supine on the anesthesia room table.   Supraclavicular block had been performed.  Right upper extremity, a well-padded tourniquet was placed on the right brachium sealed with 1000 drape.  The right upper extremity was then prepped and draped in normal sterile fashion.  Time-out was called.  Correct site was identified, and the procedure was then begun.  Attention was then turned to the right wrist where the limb was then elevated using Esmarch exsanguination and tourniquet insufflated.  On the limb, the FCR sheath was then opened proximally and distally.  Going through the floor of the FCR sheath, the FPL __________ and the pronator quadratus was elevated in an L-shaped fashion.  The patient did have a highly comminuted intra- articular fracture 3 or more fragments.  An open reduction was then performed.  Several mL of __________ bone graft substitute were then packed in the metaphyseal defect helping out the radial column. Brachioradialis was carefully released off the first and protection of the first dorsal compartment tendons in order to reduce the __________ the majority of the fracture and comminution of several fragments, 3 more fragments within the radial column alone.  An open reduction was then performed after the bone graft was then used for the metaphyseal defect.  The volar plate was then applied.  Oblong screw hole was then used with the appropriate drill bit and depth gauge measurement.  Plate height was then  adjusted using the mini C-arm.  Following this, distal fixation was then carried out from an ulnar to radial direction with the combination of locking pegs and then locking screws in the radial column.  Copious wound irrigation done throughout.  Following this, the final fixation was then carried out proximally with combination of locking and nonlocking screws.  Once the intra-articular fracture was then restored back to the near anatomical alignment, the final radiographs were then obtained.  The pronator  quadratus was then closed with 2-0 Vicryl and the subcutaneous tissues closed with 4-0 Vicryl and the skin closed with 4-0 Vicryl Rapide.  Steri-Strips were applied.  The tourniquet had been deflated.  Hemostasis was obtained.  Sterile compressive bandage was applied.  The patient was then placed in a well- padded sugar-tong splint, taken to the recovery room in good condition.  POSTPROCEDURE PLAN:  Intraoperative radiographs 3 views of the wrist did show the internal fixation in place.  There was good alignment of both planes.  The patient was discharged to home, given a good block, oral pain medications, antibiotics and seen back in the office in approximately 2 weeks for wound check, and x-rays out of the cast, out of the splint and a short-arm cast for total of 2 weeks, and then begin an outpatient therapy regimen at the 4 week mark.  Radiographs at each visit.     Melrose Nakayama, MD     FWO/MEDQ  D:  08/24/2013  T:  08/25/2013  Job:  [id number]

## 2013-08-28 ENCOUNTER — Encounter (HOSPITAL_COMMUNITY): Payer: Self-pay | Admitting: Orthopedic Surgery

## 2015-01-11 IMAGING — CR DG FOREARM 2V*R*
2 series · 2 of 2 positions shown · non-contrast
Comparison: None.

CLINICAL DATA: 57-year-old female status post fall with pain.
Initial encounter.

EXAM:
RIGHT FOREARM - 2 VIEW

[x forearm ap right]
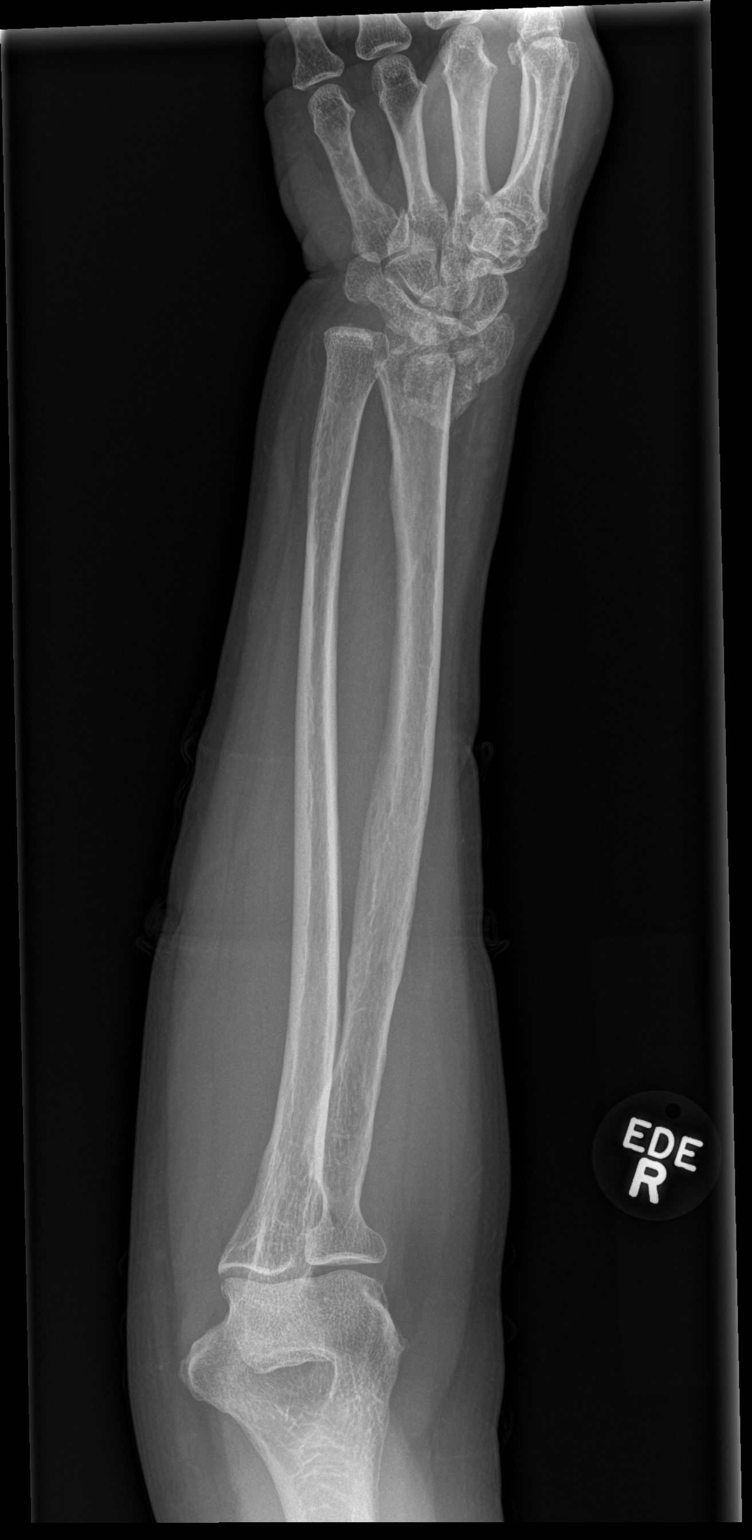

[x forearm lat right]
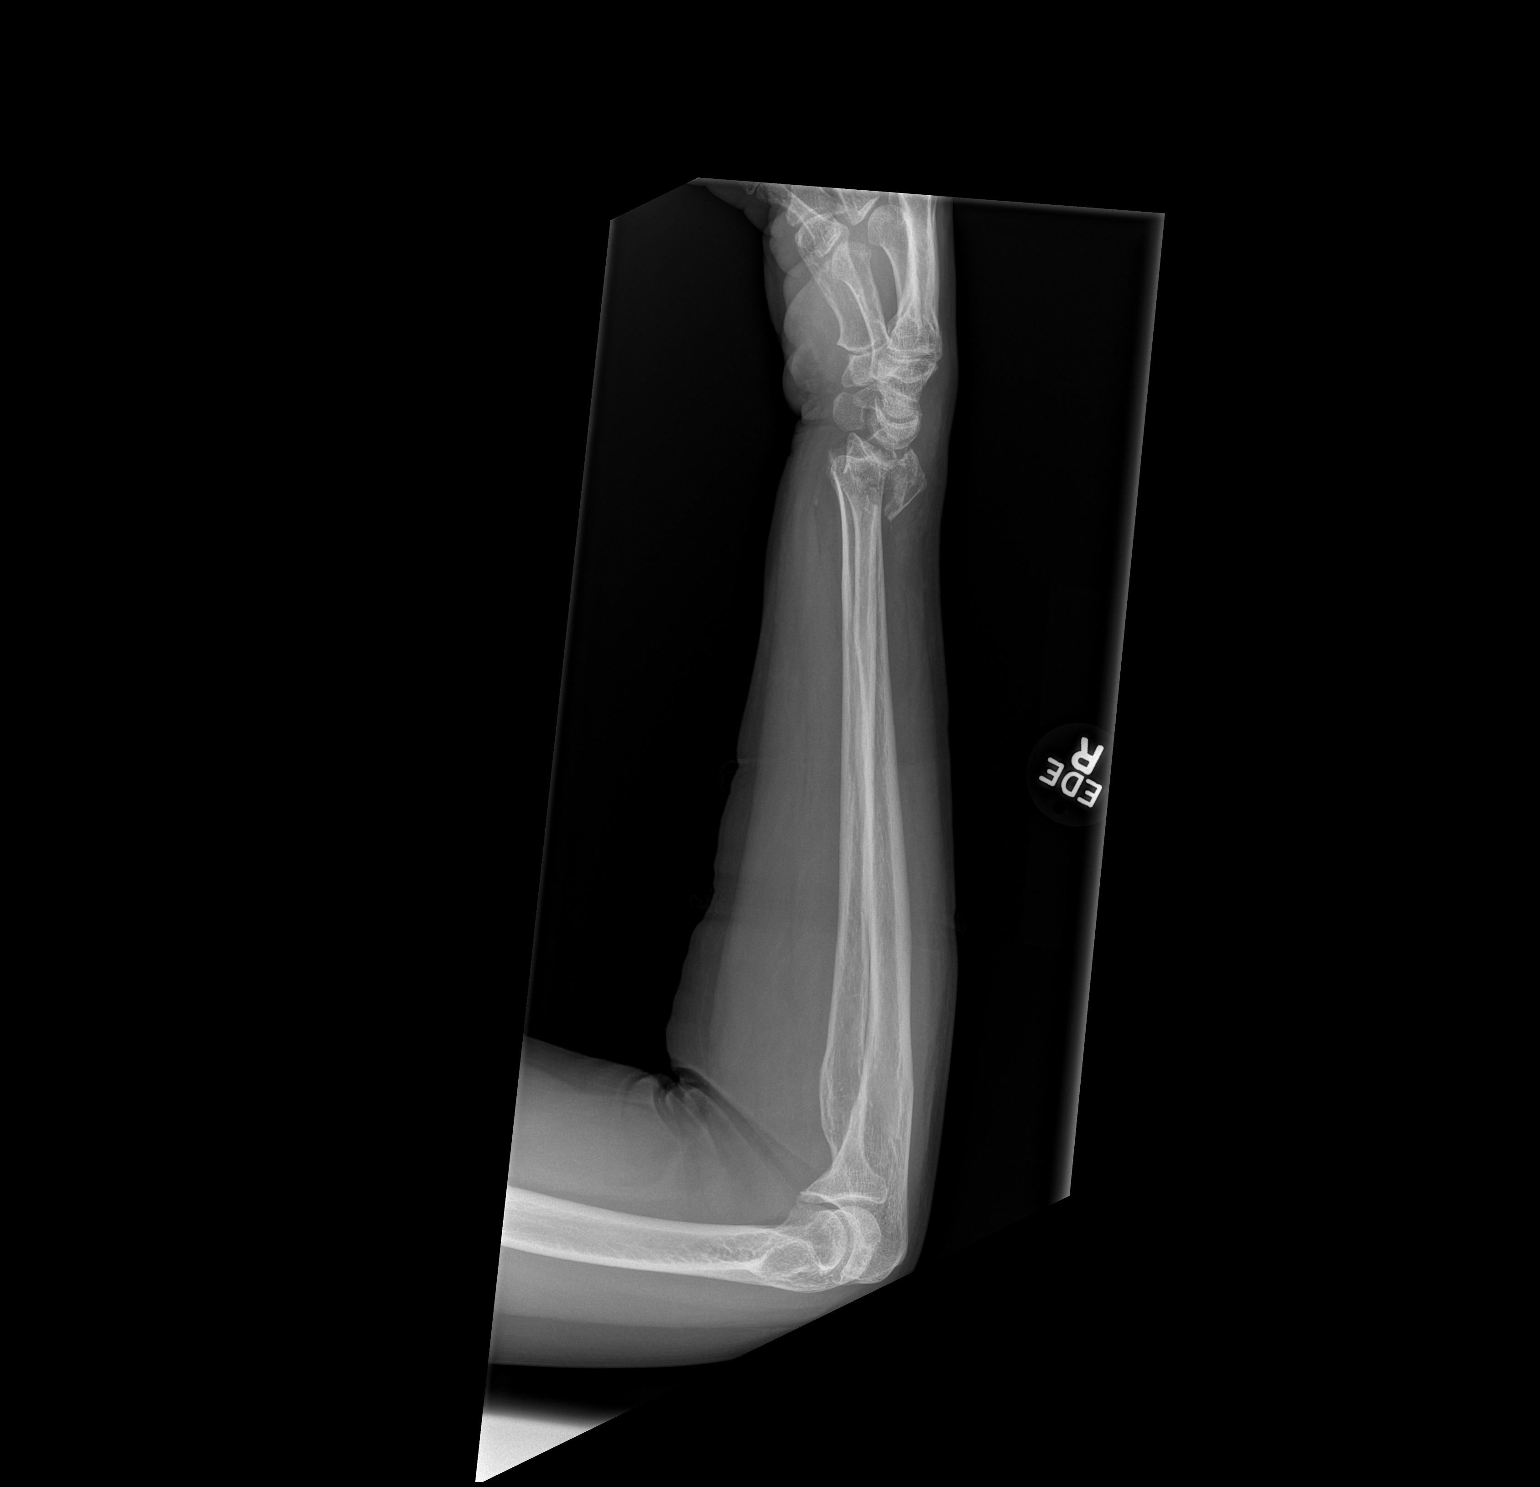

[2 of 2 positions shown; findings below may reference images not displayed]

FINDINGS: Comminuted distal right radius fracture with impaction, dorsal
displacement and angulation, and lateral displacement and
angulation. Radiocarpal joint involvement. The more proximal right
radius appears intact. The right ulna appears intact. Grossly normal
alignment at the right elbow with no evidence of elbow joint
effusion.
IMPRESSION: Comminuted, impacted, and displaced distal right radius fracture.

## 2015-01-11 IMAGING — CR DG WRIST COMPLETE 3+V*R*
4 series · 4 of 4 positions shown · non-contrast
Comparison: Right forearm series from the same day reported
separately.

CLINICAL DATA: 57-year-old female status post fall with pain.

EXAM:
RIGHT WRIST - COMPLETE 3+ VIEW

[x wrist lat right]
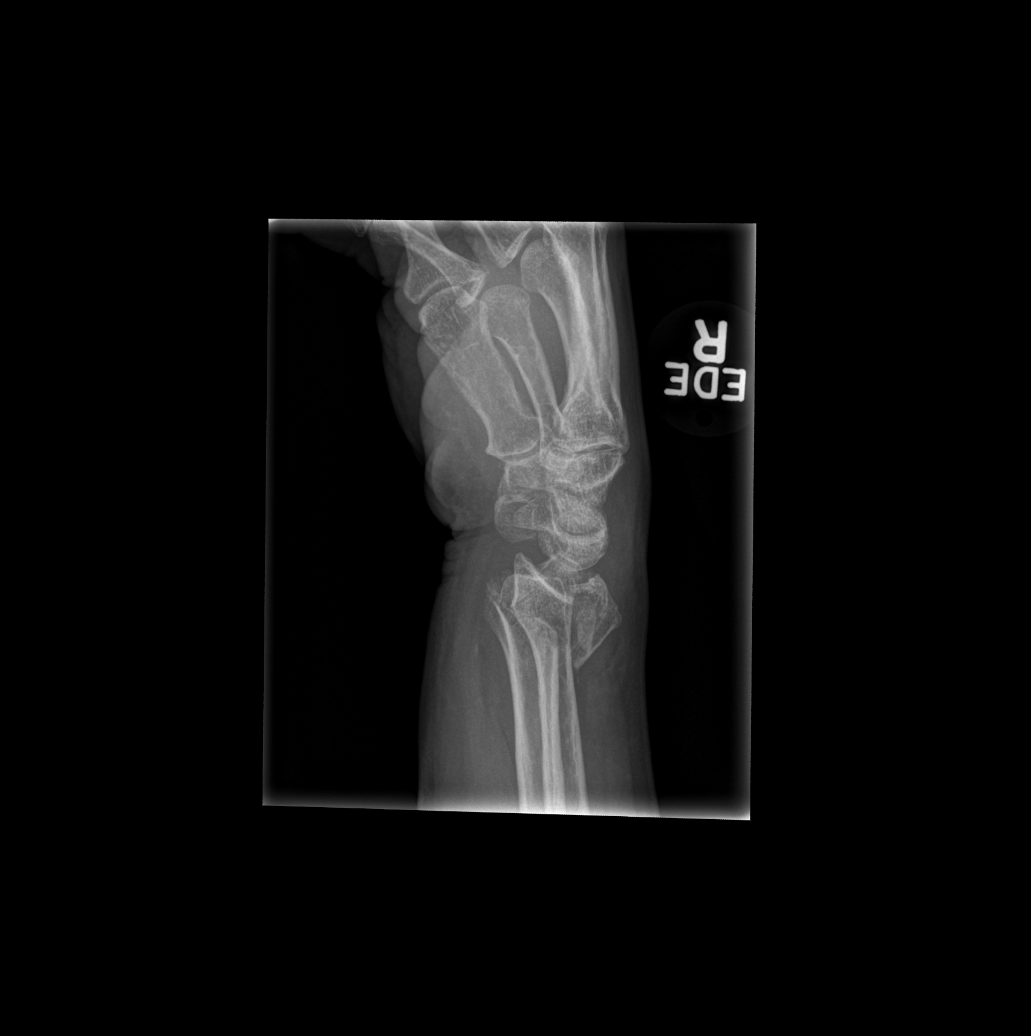

[x wrist pa right]
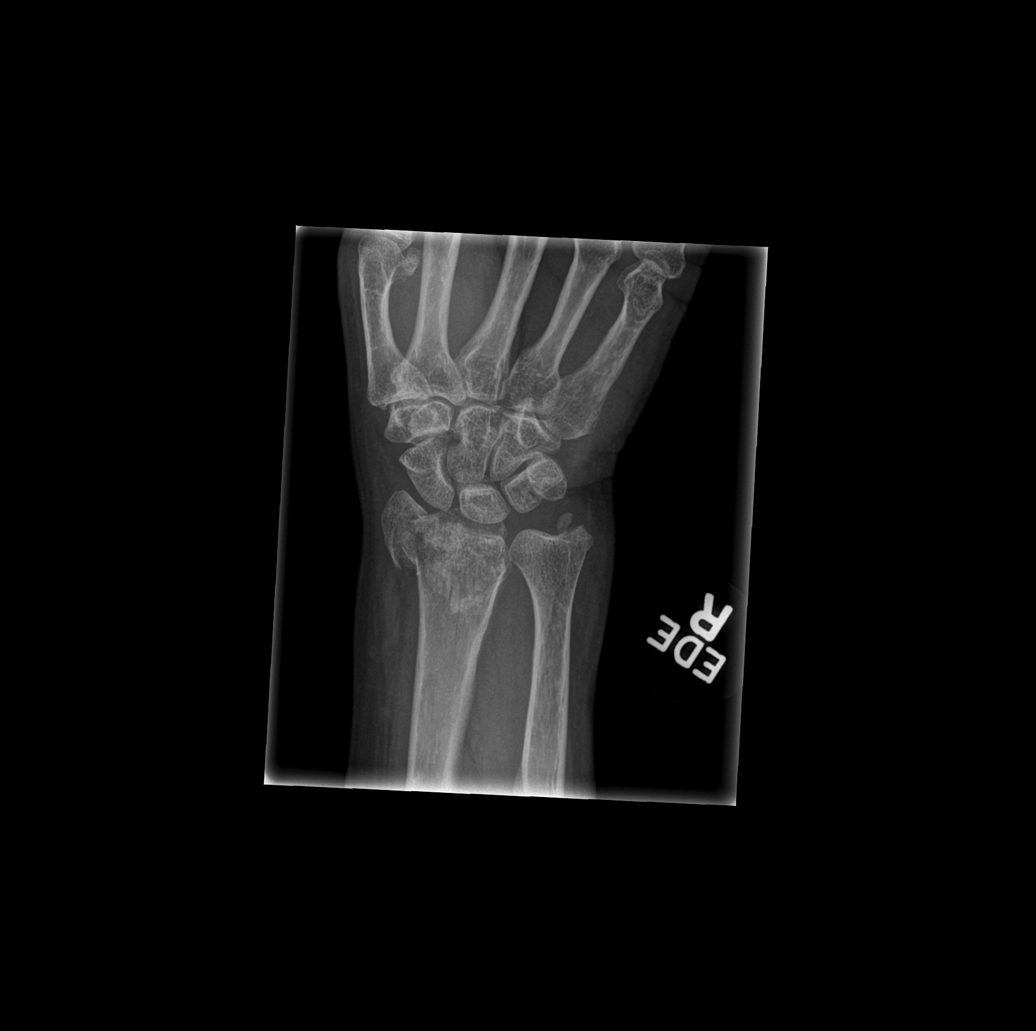

[x wrist navicular view right]
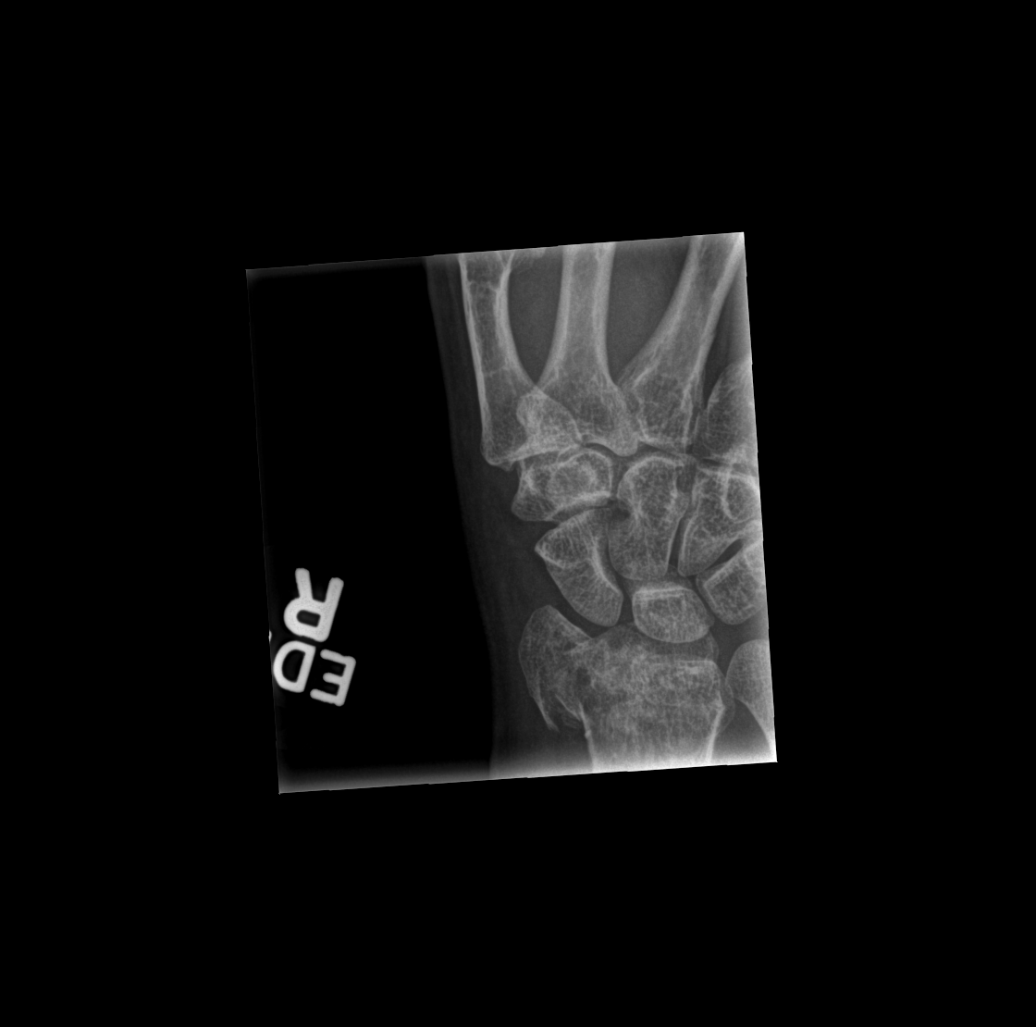

[x wrist obl right]
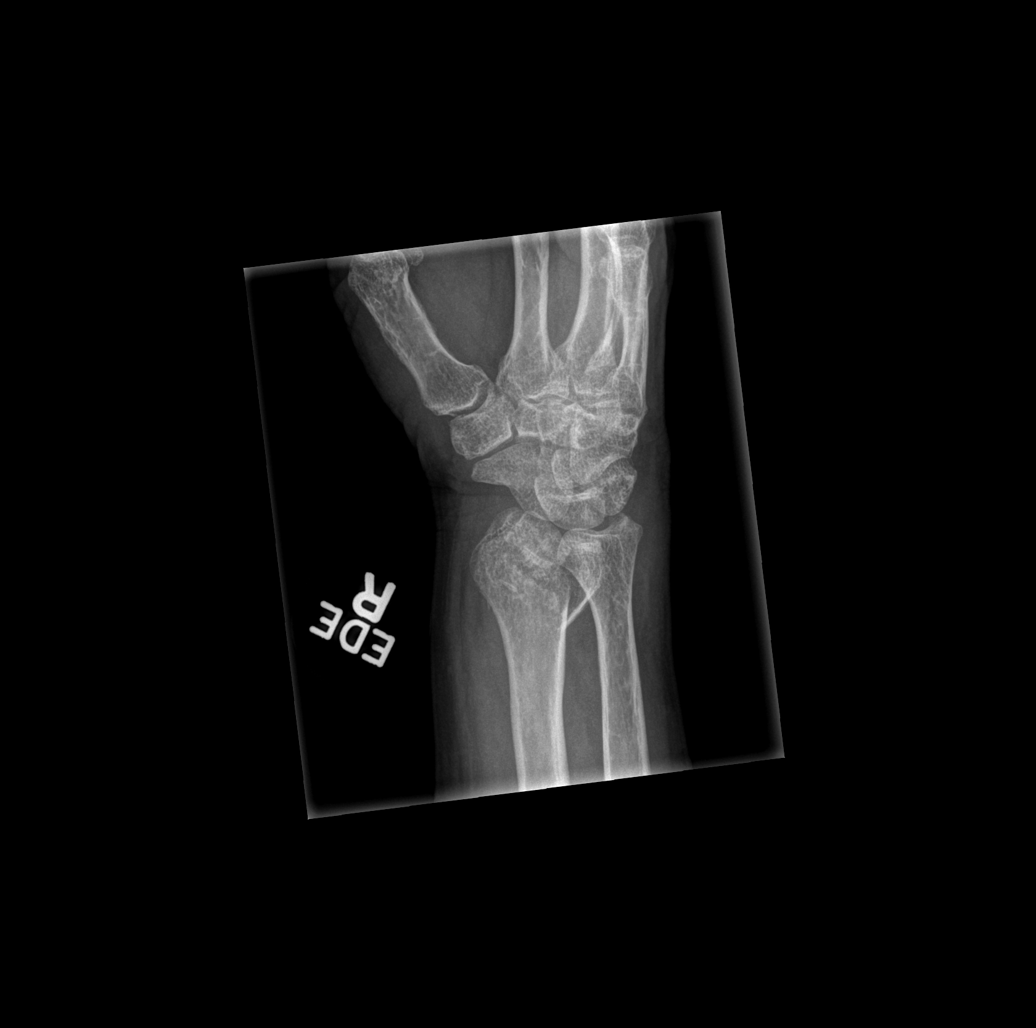

[4 of 4 positions shown; findings below may reference images not displayed]

FINDINGS: Comminuted and impacted distal right radius fracture with
radiocarpal joint and TIGER involvement. Butterfly fragments are
dorsally and laterally displaced and impacted. Dorsal angulation.

Minimally displaced ulnar styloid fracture.

Carpal bone alignment within normal limits. Scaphoid intact.
Proximal metacarpals intact.
IMPRESSION: Comminuted and impacted distal right radius fracture with
radiocarpal joint and TIGER involvement. Ulnar styloid fracture.

## 2015-09-11 ENCOUNTER — Encounter (HOSPITAL_COMMUNITY): Payer: Self-pay | Admitting: *Deleted

## 2015-09-11 ENCOUNTER — Emergency Department (INDEPENDENT_AMBULATORY_CARE_PROVIDER_SITE_OTHER)
Admission: EM | Admit: 2015-09-11 | Discharge: 2015-09-11 | Disposition: A | Discharge status: Home / Self Care | Payer: Managed Care, Other (non HMO) | Source: Home / Self Care | Attending: Family Medicine | Admitting: Family Medicine

## 2015-09-11 DIAGNOSIS — J0101 Acute recurrent maxillary sinusitis: Secondary | ICD-10-CM | POA: Diagnosis not present

## 2015-09-11 MED ORDER — AMOXICILLIN 500 MG PO CAPS: 500.0000 mg | ORAL_CAPSULE | Freq: Three times a day (TID) | ORAL | Status: DC

## 2015-09-11 MED ORDER — IPRATROPIUM BROMIDE 0.06 % NA SOLN: 2.0000 | Freq: Four times a day (QID) | NASAL | Status: DC

## 2015-09-11 NOTE — ED Provider Notes (Signed)
CSN: [id number]     Arrival date & time 09/11/15  1305 History   First MD Initiated Contact with Patient 09/11/15 1358     Chief Complaint  Patient presents with  . URI   (Consider location/radiation/quality/duration/timing/severity/associated sxs/prior Treatment) Patient is a 60 y.o. female presenting with URI. The history is provided by the patient and a relative.  URI Presenting symptoms: congestion, cough, ear pain and rhinorrhea   Presenting symptoms: no fever   Severity:  Mild Onset quality:  Gradual Duration:  4 days Progression:  Worsening Chronicity:  New Relieved by:  None tried Worsened by:  Nothing tried Ineffective treatments:  None tried Risk factors comment:  Pt is smoker   Past Medical History  Diagnosis Date  . Palpitations    Past Surgical History  Procedure Laterality Date  . Tubal ligation    . Orif wrist fracture Right 08/24/2013    Procedure: OPEN REDUCTION INTERNAL FIXATION (ORIF) RIGHT WRIST FRACTURE;  Surgeon: Linna Hoff, MD;  Location: [facility];  Service: Orthopedics;  Laterality: Right;   No family history on file. Social History  Substance Use Topics  . Smoking status: Current Every Day Smoker -- 0.25 packs/day  . Smokeless tobacco: Not on file  . Alcohol Use: No   OB History    No data available     Review of Systems  Constitutional: Negative.  Negative for fever.  HENT: Positive for congestion, ear pain, postnasal drip and rhinorrhea. Negative for ear discharge.   Respiratory: Positive for cough.   Cardiovascular: Negative.   All other systems reviewed and are negative.   Allergies  Review of patient's allergies indicates no known allergies.  Home Medications   Prior to Admission medications   Medication Sig Start Date End Date Taking? Authorizing Provider  CALCIUM PO Take 1 tablet by mouth daily.    Historical Provider, MD  docusate sodium (COLACE) 100 MG capsule Take 1 capsule (100 mg total) by mouth 2 (two) times daily.  08/24/13   Iran Planas, MD  methocarbamol (ROBAXIN) 500 MG tablet Take 1 tablet (500 mg total) by mouth 4 (four) times daily. 08/24/13   Iran Planas, MD  Multiple Vitamin (MULTIVITAMIN WITH MINERALS) TABS tablet Take 1 tablet by mouth daily.    Historical Provider, MD  oxyCODONE-acetaminophen (PERCOCET) 10-325 MG per tablet Take 1 tablet by mouth every 4 (four) hours as needed for pain. 08/24/13   Iran Planas, MD  vitamin C (ASCORBIC ACID) 500 MG tablet Take 1 tablet (500 mg total) by mouth daily. 08/24/13   Iran Planas, MD   Meds Ordered and Administered this Visit  Medications - No data to display  BP 125/73 mmHg  Pulse 73  Temp(Src) 98.6 F (37 C) (Oral)  Resp 16  SpO2 99% No data found.   Physical Exam  Constitutional: She is oriented to person, place, and time. She appears well-developed and well-nourished. No distress.  HENT:  Right Ear: Tympanic membrane, external ear and ear canal normal.  Left Ear: Ear canal normal. Tympanic membrane is bulging. Tympanic membrane mobility is abnormal. A middle ear effusion is present.  Mouth/Throat: Oropharynx is clear and moist. No oropharyngeal exudate.  Neck: Normal range of motion. Neck supple.  Cardiovascular: Regular rhythm and normal heart sounds.   Pulmonary/Chest: Effort normal and breath sounds normal.  Lymphadenopathy:    She has no cervical adenopathy.  Neurological: She is alert and oriented to person, place, and time.  Skin: Skin is warm and dry.  Nursing  note and vitals reviewed.   ED Course  Procedures (including critical care time)  Labs Review Labs Reviewed - No data to display  Imaging Review No results found.   Visual Acuity Review  Right Eye Distance:   Left Eye Distance:   Bilateral Distance:    Right Eye Near:   Left Eye Near:    Bilateral Near:         MDM  No diagnosis found.  Meds ordered this encounter  Medications  . ipratropium (ATROVENT) 0.06 % nasal spray    Sig: Place 2 sprays  into both nostrils 4 (four) times daily.    Dispense:  15 mL    Refill:  1  . amoxicillin (AMOXIL) 500 MG capsule    Sig: Take 1 capsule (500 mg total) by mouth 3 (three) times daily.    Dispense:  30 capsule    Refill:  0      Billy Fischer, MD 09/11/15 [phone]

## 2015-09-11 NOTE — ED Notes (Signed)
Pt  Reports   Symptoms  Of  Cough   And  Congestion  With  Body   Aches  And  r earache       Symptoms  Since  Last  Sat       Pt  Is  A  Smoker

## 2015-10-15 ENCOUNTER — Encounter: Payer: Self-pay | Admitting: Family Medicine

## 2015-10-17 ENCOUNTER — Encounter: Payer: Self-pay | Admitting: Physician Assistant

## 2015-10-17 ENCOUNTER — Ambulatory Visit (INDEPENDENT_AMBULATORY_CARE_PROVIDER_SITE_OTHER): Payer: Managed Care, Other (non HMO) | Admitting: Physician Assistant

## 2015-10-17 VITALS — BP 112/60 | HR 60 | Temp 98.3°F | Resp 18 | Ht 61.5 in | Wt 138.0 lb

## 2015-10-17 DIAGNOSIS — T2210XA Burn of first degree of shoulder and upper limb, except wrist and hand, unspecified site, initial encounter: Secondary | ICD-10-CM

## 2015-10-17 DIAGNOSIS — H66002 Acute suppurative otitis media without spontaneous rupture of ear drum, left ear: Secondary | ICD-10-CM | POA: Diagnosis not present

## 2015-10-17 MED ORDER — AMOXICILLIN-POT CLAVULANATE 875-125 MG PO TABS: 1.0000 | ORAL_TABLET | Freq: Two times a day (BID) | ORAL | Status: DC

## 2015-10-17 MED ORDER — SILVER SULFADIAZINE 1 % EX CREA: 1.0000 "application " | TOPICAL_CREAM | Freq: Every day | CUTANEOUS | Status: DC

## 2015-10-17 NOTE — Progress Notes (Signed)
Patient ID: Allison Baker MRN: [id number], DOB: [birth year], 60 y.o. Date of Encounter: 10/17/2015, 12:55 PM    Chief Complaint:  Chief Complaint  Patient presents with  . new pt CPE    declines PAP today, not fasting, treated last month for ear inf at [facility]  says ear still bothersome     HPI: 60 y.o. year old Hispanic female here with her daughter and granddaughter, who I see as patients here.  As well patient does not speak English but her daughter does and her daughter is interpreting/translating for her today.  They report the patient has not seen medical provider in very long time other than recent urgent care visit. They state that she has had one mammogram-- 2 years ago. They state that she has not been seeing a gynecologist or general practitioner.  They report that she recently had an urgent care visit at which time her told that her she had infection in left ear and she was treated with amoxicillin. They report that she is not having pain in the left ear but that her left ear still doesn't feel right. The remainder of the symptoms she was having at the time of the urgent care visit have resolved. She is having no drainage from the nose no nasal congestion and no cough no fevers or chills.  They also report that she burned her right forearm at work on Tuesday. State that she works at [employer]. Has been applying over-the-counter Neosporin but are wondering if there is something prescription that she can apply.  No other complaints or concerns.  Discussed addressing these issues today and then having her return fasting for complete physical exam. They are agreeable with this approach.  Home Meds:   Outpatient Prescriptions Prior to Visit  Medication Sig Dispense Refill  . Multiple Vitamin (MULTIVITAMIN WITH MINERALS) TABS tablet Take 1 tablet by mouth daily.    Marland Kitchen amoxicillin (AMOXIL) 500 MG capsule Take 1 capsule (500 mg total) by mouth 3 (three) times daily.  (Patient not taking: Reported on 10/15/2015) 30 capsule 0  . CALCIUM PO Take 1 tablet by mouth daily. Reported on 10/17/2015    . docusate sodium (COLACE) 100 MG capsule Take 1 capsule (100 mg total) by mouth 2 (two) times daily. (Patient not taking: Reported on 10/17/2015) 30 capsule 0  . ipratropium (ATROVENT) 0.06 % nasal spray Place 2 sprays into both nostrils 4 (four) times daily. (Patient not taking: Reported on 10/17/2015) 15 mL 1  . methocarbamol (ROBAXIN) 500 MG tablet Take 1 tablet (500 mg total) by mouth 4 (four) times daily. 30 tablet 0  . oxyCODONE-acetaminophen (PERCOCET) 10-325 MG per tablet Take 1 tablet by mouth every 4 (four) hours as needed for pain. 40 tablet 0  . vitamin C (ASCORBIC ACID) 500 MG tablet Take 1 tablet (500 mg total) by mouth daily. 50 tablet 0   No facility-administered medications prior to visit.    Allergies: No Known Allergies    Review of Systems: See HPI for pertinent ROS. All other ROS negative.    Physical Exam: Blood pressure 112/60, pulse 60, temperature 98.3 F (36.8 C), temperature source Oral, resp. rate 18, height 5' 1.5" (1.562 m), weight 138 lb (62.596 kg)., Body mass index is 25.66 kg/(m^2). General:  WNWD Hispanic Female. Appears in no acute distress. HEENT: Normocephalic, atraumatic, eyes without discharge, sclera non-icteric, nares are without discharge. Bilateral auditory canals clear, TM's are without perforation.  Left TM: Effusion of Inferior  portion. Golden color. Right TM: Clear, normal.  Oral cavity moist, posterior pharynx without exudate, erythema, peritonsillar abscess. No tenderness with percussion of frontal or maxillary sinuses bilaterally.  Neck: Supple. No thyromegaly. No lymphadenopathy. Lungs: Clear bilaterally to auscultation without wheezes, rales, or rhonchi. Breathing is unlabored. Heart: Regular rhythm. No murmurs, rubs, or gallops. Msk:  Strength and tone normal for age. Extremities/Skin: Right Forearm: Approx 1/2  inch x 1 inch diameter. Shallow 69mm depth. Burn. No signs of infection. Neuro: Alert and oriented X 3. Moves all extremities spontaneously. Gait is normal. CNII-XII grossly in tact. Psych:  Responds to questions appropriately with a normal affect.     ASSESSMENT AND PLAN:  60 y.o. year old female with  1. Acute suppurative otitis media of left ear without spontaneous rupture of tympanic membrane, recurrence not specified She is to take Augmentin as directed. Follow-up if symptoms do not resolve upon completion of Augmentin. - amoxicillin-clavulanate (AUGMENTIN) 875-125 MG tablet; Take 1 tablet by mouth 2 (two) times daily.  Dispense: 20 tablet; Refill: 0  2. Burn of right arm, first degree, initial encounter She is to apply Silvadene cream. - silver sulfADIAZINE (SILVADENE) 1 % cream; Apply 1 application topically daily.  Dispense: 50 g; Refill: 0  She is scheduling a complete physical exam for early morning and is to come fasting to that appointment.  [address] [place], [place], [facility] 10/17/2015 12:55 PM

## 2015-10-31 ENCOUNTER — Ambulatory Visit (INDEPENDENT_AMBULATORY_CARE_PROVIDER_SITE_OTHER): Payer: Managed Care, Other (non HMO) | Admitting: Physician Assistant

## 2015-10-31 ENCOUNTER — Encounter: Payer: Self-pay | Admitting: Physician Assistant

## 2015-10-31 VITALS — BP 104/60 | HR 56 | Temp 98.1°F | Resp 18 | Ht 62.0 in | Wt 137.0 lb

## 2015-10-31 DIAGNOSIS — Z23 Encounter for immunization: Secondary | ICD-10-CM

## 2015-10-31 DIAGNOSIS — Z0000 Encounter for general adult medical examination without abnormal findings: Secondary | ICD-10-CM

## 2015-10-31 DIAGNOSIS — J339 Nasal polyp, unspecified: Secondary | ICD-10-CM

## 2015-10-31 LAB — COMPLETE METABOLIC PANEL WITH GFR
ALT: 19 U/L (ref 6–29)
AST: 22 U/L (ref 10–35)
Albumin: 4.2 g/dL (ref 3.6–5.1)
Alkaline Phosphatase: 89 U/L (ref 33–130)
BUN: 13 mg/dL (ref 7–25)
CO2: 24 mmol/L (ref 20–31)
Calcium: 9.2 mg/dL (ref 8.6–10.4)
Chloride: 106 mmol/L (ref 98–110)
Creat: 0.69 mg/dL (ref 0.50–0.99)
GFR, Est African American: >89 mL/min (ref >=60)
GFR, Est Non African American: >89 mL/min (ref >=60)
Glucose, Bld: 78 mg/dL (ref 70–99)
Potassium: 4.3 mmol/L (ref 3.5–5.3)
Sodium: 142 mmol/L (ref 135–146)
Total Bilirubin: 0.6 mg/dL (ref 0.2–1.2)
Total Protein: 6.6 g/dL (ref 6.1–8.1)

## 2015-10-31 LAB — CBC WITH DIFFERENTIAL/PLATELET
Basophils Absolute: 0.1 10*3/uL (ref 0.0–0.1)
Basophils Relative: 1 % (ref 0–1)
Eosinophils Absolute: 0.2 10*3/uL (ref 0.0–0.7)
Eosinophils Relative: 3 % (ref 0–5)
HCT: 40 % (ref 36.0–46.0)
Hemoglobin: 13 g/dL (ref 12.0–15.0)
Lymphocytes Relative: 44 % (ref 12–46)
Lymphs Abs: 2.6 10*3/uL (ref 0.7–4.0)
MCH: 29.8 pg (ref 26.0–34.0)
MCHC: 32.5 g/dL (ref 30.0–36.0)
MCV: 91.7 fL (ref 78.0–100.0)
MPV: 10 fL (ref 8.6–12.4)
Monocytes Absolute: 0.6 10*3/uL (ref 0.1–1.0)
Monocytes Relative: 10 % (ref 3–12)
Neutro Abs: 2.4 10*3/uL (ref 1.7–7.7)
Neutrophils Relative %: 42 % — ABNORMAL LOW (ref 43–77)
Platelets: 312 10*3/uL (ref 150–400)
RBC: 4.36 MIL/uL (ref 3.87–5.11)
RDW: 14.4 % (ref 11.5–15.5)
WBC: 5.8 10*3/uL (ref 4.0–10.5)

## 2015-10-31 LAB — LIPID PANEL
Cholesterol: 190 mg/dL (ref 125–200)
HDL: 55 mg/dL (ref >=46)
LDL Cholesterol: 114 mg/dL (ref <130)
Total CHOL/HDL Ratio: 3.5 Ratio (ref <=5.0)
Triglycerides: 107 mg/dL (ref <150)
VLDL: 21 mg/dL (ref <30)

## 2015-10-31 LAB — TSH: TSH: 2.95 mIU/L

## 2015-10-31 MED ORDER — FLUTICASONE PROPIONATE 50 MCG/ACT NA SUSP
2.0000 | Freq: Every day | NASAL | Status: DC
Start: 2015-10-31 — End: 2019-01-31

## 2015-10-31 NOTE — Progress Notes (Signed)
Patient ID: Allison Baker MRN: [id number], DOB: 07-Apr-1956, 60 y.o. Date of Encounter: 10/31/2015,   Chief Complaint: Physical (CPE)  HPI: 60 y.o. y/o female here for CPE.   She is here with her daughter and granddaughter. Her daughter translates/interprets Spanish to [place] for [place].  Patient has seen no PCP or gynecologist in very long time. She has had one mammogram about 3 years ago. Has never had a colonoscopy. Has had no hysterectomy.  She reports that she has recently noticed a "bump" in her nose. Says that it was irritated but not feeling irritated anymore but still feels a bump there.  No other complaints or concerns.   Review of Systems: Consitutional: No fever, chills, fatigue, night sweats, lymphadenopathy. No significant/unexplained weight changes. Eyes: No visual changes, eye redness, or discharge. ENT/Mouth: No ear pain, sore throat, nasal drainage, or sinus pain. Cardiovascular: No chest pressure,heaviness, tightness or squeezing, even with exertion. No increased shortness of breath or dyspnea on exertion.No palpitations, edema, orthopnea, PND. Respiratory: No cough, hemoptysis, SOB, or wheezing. Gastrointestinal: No anorexia, dysphagia, reflux, pain, nausea, vomiting, hematemesis, diarrhea, constipation, BRBPR, or melena. Breast: No mass, nodules, bulging, or retraction. No skin changes or inflammation. No nipple discharge. No lymphadenopathy. Genitourinary: No dysuria, hematuria, incontinence, vaginal discharge, pruritis, burning, abnormal bleeding, or pain. Musculoskeletal: No decreased ROM, No joint pain or swelling. No significant pain in neck, back, or extremities. Skin: No rash, pruritis, or concerning lesions. Neurological: No headache, dizziness, syncope, seizures, tremors, memory loss, coordination problems, or paresthesias. Psychological: No anxiety, depression, hallucinations, SI/HI. Endocrine: No polydipsia, polyphagia, polyuria, or known diabetes.No  increased fatigue. No palpitations/rapid heart rate. No significant/unexplained weight change. All other systems were reviewed and are otherwise negative.  Past Medical History  Diagnosis Date  . Palpitations      Past Surgical History  Procedure Laterality Date  . Tubal ligation    . Orif wrist fracture Right 08/24/2013    Procedure: OPEN REDUCTION INTERNAL FIXATION (ORIF) RIGHT WRIST FRACTURE;  Surgeon: Linna Hoff, MD;  Location: [facility];  Service: Orthopedics;  Laterality: Right;  . Fracture surgery  08/2013    HAND    Home Meds:  Outpatient Prescriptions Prior to Visit  Medication Sig Dispense Refill  . CALCIUM PO Take 1 tablet by mouth daily. Reported on 10/17/2015    . Multiple Vitamin (MULTIVITAMIN WITH MINERALS) TABS tablet Take 1 tablet by mouth daily.    . silver sulfADIAZINE (SILVADENE) 1 % cream Apply 1 application topically daily. 50 g 0  . amoxicillin (AMOXIL) 500 MG capsule Take 1 capsule (500 mg total) by mouth 3 (three) times daily. (Patient not taking: Reported on 10/15/2015) 30 capsule 0  . amoxicillin-clavulanate (AUGMENTIN) 875-125 MG tablet Take 1 tablet by mouth 2 (two) times daily. 20 tablet 0  . docusate sodium (COLACE) 100 MG capsule Take 1 capsule (100 mg total) by mouth 2 (two) times daily. (Patient not taking: Reported on 10/17/2015) 30 capsule 0  . ipratropium (ATROVENT) 0.06 % nasal spray Place 2 sprays into both nostrils 4 (four) times daily. (Patient not taking: Reported on 10/17/2015) 15 mL 1   No facility-administered medications prior to visit.    Allergies: No Known Allergies  Social History   Social History  . Marital Status: Single    Spouse Name: N/A  . Number of Children: N/A  . Years of Education: N/A   Occupational History  . Not on file.   Social History Main Topics  . Smoking status: Current  Every Day Smoker -- 0.00 packs/day    Types: Cigarettes  . Smokeless tobacco: Never Used  . Alcohol Use: No  . Drug Use: No  . Sexual  Activity: Not Currently   Other Topics Concern  . Not on file   Social History Narrative    History reviewed. No pertinent family history.  Physical Exam: Blood pressure 104/60, pulse 56, temperature 98.1 F (36.7 C), temperature source Oral, resp. rate 18, height 5\' 2"  (1.575 m), weight 137 lb (62.143 kg)., Body mass index is 25.05 kg/(m^2). General: Well developed, well nourished, Hispanic Female. Appears in no acute distress. HEENT: Normocephalic, atraumatic. Conjunctiva pink, sclera non-icteric. Pupils 2 mm constricting to 1 mm, round, regular, and equally reactive to light and accomodation. EOMI. Internal auditory canal clear. TMs with good cone of light and without pathology.  No sinus tenderness. Oral mucosa pink.  Pharynx without exudate.  Nares--bilateral--with protrusions (possibly polyps), erythema.  Neck: Supple. Trachea midline. No thyromegaly. Full ROM. No lymphadenopathy.No Carotid Bruits. Lungs: Clear to auscultation bilaterally without wheezes, rales, or rhonchi. Breathing is of normal effort and unlabored. Cardiovascular: RRR with S1 S2. No murmurs, rubs, or gallops. Distal pulses 2+ symmetrically. No carotid or abdominal bruits. Breast: Symmetrical. No masses. Nipples without discharge. Abdomen: Soft, non-tender, non-distended with normoactive bowel sounds. No hepatosplenomegaly or masses. No rebound/guarding. No CVA tenderness. No hernias.  Genitourinary:  External genitalia without lesions. Vaginal mucosa pink.No discharge present. Cervix pink and without discharge. No cervical tenderness.Normal uterus size. No adnexal mass or tenderness.  Pap smear taken. Musculoskeletal: Full range of motion and 5/5 strength throughout. Without swelling, atrophy, tenderness, crepitus, or warmth. Extremities without clubbing, cyanosis, or edema. Calves supple. Skin: Warm and moist without erythema, ecchymosis, wounds, or rash. Neuro: A+Ox3. CN II-XII grossly intact. Moves all extremities  spontaneously. Full sensation throughout. Normal gait. DTR 2+ throughout upper and lower extremities. Finger to nose intact. Psych:  Responds to questions appropriately with a normal affect.   Assessment/Plan:  59 y.o. y/o female here for CPE  1. Visit for preventive health examination  A. Screening Labs: She is fasting and does want to get screening labs while she is here. - CBC with Differential/Platelet - COMPLETE METABOLIC PANEL WITH GFR - Lipid panel - TSH - VITAMIN D 25 Hydroxy (Vit-D Deficiency, Fractures)  B. Pap: - PAP, Thin Prep w/HPV rflx HPV Type 16/18  C. Screening Mammogram: - MM Digital Screening; Future  D. DEXA/BMD:  Can wait until closer to age 19 to start bone density screening.  E. Colorectal Cancer Screening: She has never had a colonoscopy. Discussed recommendation for this risk benefits of the procedure and she is agreeable to proceed. Will refer to GI. - Ambulatory referral to Gastroenterology   F. Immunizations:  Influenza:  N/A--March 30th Tetanus:   She has had no tetanus vaccine in greater than 10 years. Agreeable to update today. Given here 10/31/15 Pneumococcal: She smokes very small amount only 1-3 cigarettes per day and has never smoked more than this. Will hold off on doing pneumonia vaccine. Zostavax: She will need to check with her insurance regarding coverage and cost and follow-up with [place] with this information.   Noted Family History Excellent !! Mother passed away at age 64 Father deceased at age 42  2. Nasal polyps Will go ahead and treat with Flonase but she needs to have follow-up with ENT. Explained this to patient and daughter and they are agreeable and voice understanding with need for follow-up with ENT. - fluticasone (  FLONASE) 50 MCG/ACT nasal spray; Place 2 sprays into both nostrils daily.  Dispense: 16 g; Refill: 6 - Ambulatory referral to ENT    Signed, Karis Juba, PA, [facility] 10/31/2015 9:21 AM

## 2015-10-31 NOTE — Addendum Note (Signed)
Addended by: Olena Mater on: 10/31/2015 10:11 AM   Modules accepted: Orders

## 2015-11-01 LAB — VITAMIN D 25 HYDROXY (VIT D DEFICIENCY, FRACTURES): Vit D, 25-Hydroxy: 26 ng/mL — ABNORMAL LOW (ref 30–100)

## 2015-11-05 LAB — PAP, THIN PREP W/HPV RFLX HPV TYPE 16/18: HPV DNA High Risk: NOT DETECTED

## 2016-07-15 ENCOUNTER — Ambulatory Visit (INDEPENDENT_AMBULATORY_CARE_PROVIDER_SITE_OTHER): Payer: Managed Care, Other (non HMO) | Admitting: Family Medicine

## 2016-07-15 ENCOUNTER — Encounter: Payer: Self-pay | Admitting: Family Medicine

## 2016-07-15 ENCOUNTER — Ambulatory Visit: Payer: Managed Care, Other (non HMO) | Admitting: Physician Assistant

## 2016-07-15 VITALS — BP 100/62 | HR 58 | Temp 98.6°F | Resp 14 | Ht 62.0 in | Wt 135.0 lb

## 2016-07-15 DIAGNOSIS — B079 Viral wart, unspecified: Secondary | ICD-10-CM

## 2016-07-15 NOTE — Progress Notes (Signed)
   Subjective:    Patient ID: Allison Baker, female    DOB: Feb 10, 1956, 60 y.o.   MRN: [id number]  Patient presents for Knot to Thumb (area is growing and painful)  Before meals year with a lesion on her right thumb has been present since March. She states she used some type of cream on it not sure what this was. It has been growing in size and very tender to touch. She's not had any other lesions on her skin. Not had any drainage from it. She would also like flu shot [place] speaking her adult daughter was the translator   Review Of Systems:  GEN- denies fatigue, fever, weight loss,weakness, recent illness HEENT- denies eye drainage, change in vision, nasal discharge, CVS- denies chest pain, palpitations RESP- denies SOB, cough, wheeze MSK- denies joint pain, muscle aches, injury        Objective:    BP 100/62 (BP Location: Left Arm, Patient Position: Sitting, Cuff Size: Large)   Pulse (!) 58   Temp 98.6 F (37 C) (Oral)   Resp 14   Ht 5\' 2"  (1.575 m)   Wt 135 lb (61.2 kg)   SpO2 98%   BMI 24.69 kg/m  GEN- NAD, alert and oriented x3 Ext- Right thumb- hyperpigmented callus lesion TTP, few black dot lesions, no erythema, no fluctuance  no other lesions bilat hands, arms Pulse 2+        Assessment & Plan:      Problem List Items Addressed This Visit    None    Visit Diagnoses    Viral wart on finger    -  Primary   based on apperance treat wit liquid nitrogen  For possible wart,  verbal consent obtained 3 passes 5-10sec each tolerated well. recheck in 2 weeks if not better plan to punch biopsy       Note: This dictation was prepared with Dragon dictation along with smaller phrase technology. Any transcriptional errors that result from this process are unintentional.

## 2016-07-15 NOTE — Patient Instructions (Signed)
F/U 2 weeks for recheck Flu shot

## 2016-07-29 ENCOUNTER — Ambulatory Visit: Payer: Managed Care, Other (non HMO) | Admitting: Family Medicine

## 2016-08-11 ENCOUNTER — Ambulatory Visit: Payer: Managed Care, Other (non HMO) | Admitting: Family Medicine

## 2016-08-12 ENCOUNTER — Ambulatory Visit: Payer: Managed Care, Other (non HMO) | Admitting: Family Medicine

## 2016-08-19 ENCOUNTER — Ambulatory Visit: Payer: Managed Care, Other (non HMO) | Admitting: Family Medicine

## 2016-08-26 ENCOUNTER — Ambulatory Visit: Payer: Managed Care, Other (non HMO) | Admitting: Family Medicine

## 2017-10-20 ENCOUNTER — Encounter: Payer: Self-pay | Admitting: Family Medicine

## 2017-10-20 ENCOUNTER — Ambulatory Visit (INDEPENDENT_AMBULATORY_CARE_PROVIDER_SITE_OTHER): Payer: Managed Care, Other (non HMO) | Admitting: Family Medicine

## 2017-10-20 ENCOUNTER — Other Ambulatory Visit: Payer: Self-pay

## 2017-10-20 VITALS — BP 110/64 | HR 68 | Temp 98.1°F | Resp 14 | Ht 62.0 in | Wt 137.0 lb

## 2017-10-20 DIAGNOSIS — L84 Corns and callosities: Secondary | ICD-10-CM | POA: Diagnosis not present

## 2017-10-20 DIAGNOSIS — M79671 Pain in right foot: Secondary | ICD-10-CM | POA: Diagnosis not present

## 2017-10-20 DIAGNOSIS — B079 Viral wart, unspecified: Secondary | ICD-10-CM

## 2017-10-20 NOTE — Patient Instructions (Addendum)
Referral to foot doctor  F/U Physical

## 2017-10-20 NOTE — Progress Notes (Signed)
   Subjective:    Patient ID: Allison Baker, female    DOB: Mar 15, 1956, 62 y.o.   MRN: [id number]  Patient presents for R Foot Pain (x months- wart like appearances (x2) to bottom of foot (plantar wart?)- painful ambulation) and Thumb Pain (wart on finger)   Daughter was interpreter  Pt here with continued wart on right thumb, had cryotherapy once back in Dec 2017 then lost her insurance, has not put anything on it since, does not hurt   Right foot, has spot on bottom of foot- callus like lesion states has had 1 years ago, they scraped and burned??, very painful has been present for months   End of visit, states she wanted to have her heart looked at and labs, no pain or issues just wants to be checked, has some tingling on and off in fingers for quite some time  - she has physicial scheduled in 3 weeks, we will address those other issues then    Review Of Systems:  GEN- denies fatigue, fever, weight loss,weakness, recent illness HEENT- denies eye drainage, change in vision, nasal discharge, CVS- denies chest pain, palpitations RESP- denies SOB, cough, wheeze ABD- denies N/V, change in stools, abd pain GU- denies dysuria, hematuria, dribbling, incontinence MSK- denies joint pain, muscle aches, injury Neuro- denies headache, dizziness, syncope, seizure activity       Objective:    BP 110/64   Pulse 68   Temp 98.1 F (36.7 C) (Oral)   Resp 14   Ht 5\' 2"  (1.575 m)   Wt 137 lb (62.1 kg)   SpO2 98%   BMI 25.06 kg/m  GEN- NAD, alert and oriented x3 Skin- Right thumb- palm surfrace- hyperpigemented warty lesion, NT  Right foot- mid foot- callus with appears to be small ulceration at center but scab across, flesh toned, no erythema, TTP  Pulse- DP, PT 2+        Assessment & Plan:      Problem List Items Addressed This Visit    None    Visit Diagnoses    Viral wart on finger    -  Primary   Cryotherapy performed via liquid nitrogen spray gun, 1 pass x 5 seconds, notced  black dot at center but scab, 11 blade used to remove top layer of skin then cryotherapy done again  recheck in 3 weeks  Bandage applied Consent obtaibed    Pre-ulcerative calluses       Painful callus, I dont see typical signs of Wart, needs podiatry to debride/evaulate the area, which I explained to her multiple times  I would not cut open the lesion on her foot       Note: This dictation was prepared with Dragon dictation along with smaller phrase technology. Any transcriptional errors that result from this process are unintentional.

## 2017-10-21 ENCOUNTER — Encounter: Payer: Self-pay | Admitting: Family Medicine

## 2017-11-03 ENCOUNTER — Other Ambulatory Visit: Payer: Managed Care, Other (non HMO)

## 2017-11-03 DIAGNOSIS — Z0000 Encounter for general adult medical examination without abnormal findings: Secondary | ICD-10-CM

## 2017-11-03 DIAGNOSIS — Z13220 Encounter for screening for lipoid disorders: Secondary | ICD-10-CM

## 2017-11-03 LAB — CBC WITH DIFFERENTIAL/PLATELET
Basophils Absolute: 74 cells/uL (ref 0–200)
Basophils Relative: 1 %
Eosinophils Absolute: 340 cells/uL (ref 15–500)
Eosinophils Relative: 4.6 %
HCT: 41.1 % (ref 35.0–45.0)
Hemoglobin: 13.9 g/dL (ref 11.7–15.5)
Lymphs Abs: 2494 cells/uL (ref 850–3900)
MCH: 30.3 pg (ref 27.0–33.0)
MCHC: 33.8 g/dL (ref 32.0–36.0)
MCV: 89.7 fL (ref 80.0–100.0)
MPV: 10.3 fL (ref 7.5–12.5)
Monocytes Relative: 9.9 %
Neutro Abs: 3759 cells/uL (ref 1500–7800)
Neutrophils Relative %: 50.8 %
Platelets: 311 10*3/uL (ref 140–400)
RBC: 4.58 10*6/uL (ref 3.80–5.10)
RDW: 12.7 % (ref 11.0–15.0)
Total Lymphocyte: 33.7 %
WBC mixed population: 733 cells/uL (ref 200–950)
WBC: 7.4 10*3/uL (ref 3.8–10.8)

## 2017-11-03 LAB — COMPLETE METABOLIC PANEL WITH GFR
AG Ratio: 1.8 (calc) (ref 1.0–2.5)
ALT: 25 U/L (ref 6–29)
AST: 21 U/L (ref 10–35)
Albumin: 4.3 g/dL (ref 3.6–5.1)
Alkaline phosphatase (APISO): 89 U/L (ref 33–130)
BUN: 13 mg/dL (ref 7–25)
CO2: 29 mmol/L (ref 20–32)
Calcium: 9.7 mg/dL (ref 8.6–10.4)
Chloride: 106 mmol/L (ref 98–110)
Creat: 0.81 mg/dL (ref 0.50–0.99)
GFR, Est African American: 90 mL/min/{1.73_m2} (ref >=60)
GFR, Est Non African American: 77 mL/min/{1.73_m2} (ref >=60)
Globulin: 2.4 g/dL (calc) (ref 1.9–3.7)
Glucose, Bld: 82 mg/dL (ref 65–99)
Potassium: 4.8 mmol/L (ref 3.5–5.3)
Sodium: 141 mmol/L (ref 135–146)
Total Bilirubin: 0.5 mg/dL (ref 0.2–1.2)
Total Protein: 6.7 g/dL (ref 6.1–8.1)

## 2017-11-03 LAB — LIPID PANEL
Cholesterol: 196 mg/dL (ref <200)
HDL: 51 mg/dL (ref >50)
LDL Cholesterol (Calc): 124 mg/dL (calc) — ABNORMAL HIGH
Non-HDL Cholesterol (Calc): 145 mg/dL (calc) — ABNORMAL HIGH (ref <130)
Total CHOL/HDL Ratio: 3.8 (calc) (ref <5.0)
Triglycerides: 103 mg/dL (ref <150)

## 2017-11-09 ENCOUNTER — Other Ambulatory Visit: Payer: Self-pay

## 2017-11-09 ENCOUNTER — Ambulatory Visit (INDEPENDENT_AMBULATORY_CARE_PROVIDER_SITE_OTHER): Payer: Managed Care, Other (non HMO) | Admitting: Family Medicine

## 2017-11-09 ENCOUNTER — Encounter: Payer: Self-pay | Admitting: Family Medicine

## 2017-11-09 VITALS — BP 118/62 | HR 66 | Temp 98.2°F | Resp 14 | Ht 62.0 in | Wt 135.0 lb

## 2017-11-09 DIAGNOSIS — Z1159 Encounter for screening for other viral diseases: Secondary | ICD-10-CM

## 2017-11-09 DIAGNOSIS — Z1211 Encounter for screening for malignant neoplasm of colon: Secondary | ICD-10-CM | POA: Diagnosis not present

## 2017-11-09 DIAGNOSIS — R079 Chest pain, unspecified: Secondary | ICD-10-CM | POA: Diagnosis not present

## 2017-11-09 DIAGNOSIS — Z1231 Encounter for screening mammogram for malignant neoplasm of breast: Secondary | ICD-10-CM

## 2017-11-09 DIAGNOSIS — Z0000 Encounter for general adult medical examination without abnormal findings: Secondary | ICD-10-CM

## 2017-11-09 DIAGNOSIS — Z1239 Encounter for other screening for malignant neoplasm of breast: Secondary | ICD-10-CM

## 2017-11-09 DIAGNOSIS — Z114 Encounter for screening for human immunodeficiency virus [HIV]: Secondary | ICD-10-CM | POA: Diagnosis not present

## 2017-11-09 DIAGNOSIS — R202 Paresthesia of skin: Secondary | ICD-10-CM

## 2017-11-09 DIAGNOSIS — Z8041 Family history of malignant neoplasm of ovary: Secondary | ICD-10-CM

## 2017-11-09 DIAGNOSIS — E785 Hyperlipidemia, unspecified: Secondary | ICD-10-CM | POA: Diagnosis not present

## 2017-11-09 NOTE — Progress Notes (Signed)
Subjective:    Patient ID: Allison Baker, female    DOB: 1956-07-31, 62 y.o.   MRN: [id number]  Patient presents for CPE (has had labs)  Here for complete physical exam.  Family history reviewed Medications reviewed She had recent fasting labs LDL was mildly elevated at 703 metabolic panel CBC were normal She is overdue for colonoscopy Due for mammogram Discussed HIV and hepatitis C screening Normal Pap smear 2017  Immunizations she has had tetanus booster but due for shingles vaccine  She is also had episodes where she has palpitations chest discomfort shortness of breath when she tries to lie down she is also had some tingling and numbness into her left hand states that this is been going on for a few months.  She went to a natural doctor who states that she looked in her eyes and her was a circulation problem therefore gave her some type of homeopathic medicine to drink.  She states that she has not had any further chest pain or shortness of breath but still gets some tingling sensation into her left hand.  She is worried about her heart and would like to have this checked further.  We also discussed family history she has multiple cousins family members that have had cancer.  She recently found out that her sister has ovarian cancer and was told there was genetic she is currently being treated in [place] her sisters daughter also has ovarian cancer.  She would like to have genetic counseling.  Also has vague symptoms of headaches on and off.  Tends to have tenderness at the spot of the wart on her right thumb would like to have it cut out completely.  She is on schedule with the podiatrist for the lesion on her foot.  Fasting labs were reviewed at the bedside normal except for mildly elevated LDL at 124 Review Of Systems:  GEN- denies fatigue, fever, weight loss,weakness, recent illness HEENT- denies eye drainage, change in vision, nasal discharge, CVS- denies chest pain,  palpitations RESP- denies SOB, cough, wheeze ABD- denies N/V, change in stools, abd pain GU- denies dysuria, hematuria, dribbling, incontinence MSK- denies joint pain, muscle aches, injury Neuro- denies headache, dizziness, syncope, seizure activity       Objective:    BP 118/62   Pulse 66   Temp 98.2 F (36.8 C) (Oral)   Resp 14   Ht 5\' 2"  (1.575 m)   Wt 135 lb (61.2 kg)   SpO2 97%   BMI 24.69 kg/m  GEN- NAD, alert and oriented x3 HEENT- PERRL, EOMI, non injected sclera, pink conjunctiva, MMM, oropharynx clear Neck- Supple, no thyromegaly CVS- RRR, no murmur RESP-CTAB ABD-NABS,soft,NT,ND NEURO-CNII-XII in tact no deficits  Skin- hypopigmernted wart like lesion right thumb EXT- No edema Pulses- Radial, DP- 2+  EKG- NSR, no ST changes, poor r wave progression       Assessment & Plan:      Problem List Items Addressed This Visit      Unprioritized   Mild hyperlipidemia    Dietary changes       Other Visit Diagnoses    Routine general medical examination at a health care facility    -  Primary   CPE done , pt to  schedule mammogram, Hep C/HIV test done   Relevant Orders   EKG 12-Lead (Completed)   Need for hepatitis C screening test       Relevant Orders   Hepatitis C antibody (Completed)   Encounter  for screening for HIV       Relevant Orders   HIV antibody (Completed)   Chest pain, unspecified type       EKG shows some poor r wave progressive, history of lung disease as a child, her symptoms sounded like angina but then cleared with this natural medicine?? She w   Relevant Orders   Ambulatory referral to Cardiology   Family history of ovarian cancer       Referral for genetic testing at request, we have no records from her family   Relevant Orders   Ambulatory referral to Genetics   Breast cancer screening       Relevant Orders   MM DIGITAL SCREENING BILATERAL   Colon cancer screening       Relevant Orders   Ambulatory referral to  Gastroenterology   Left hand paresthesia       difficult to tell if this related to the her cardiac symptoms, or more stand along, carpal tunnel or arthritic symptom      Note: This dictation was prepared with Dragon dictation along with smaller phrase technology. Any transcriptional errors that result from this process are unintentional.

## 2017-11-09 NOTE — Patient Instructions (Addendum)
Referral to heart doctor Schedule Mammogram  Referral for colonoscopy  Watch the fried foods  Genetic testing for ovarian cancer Hepatitis C and HIV testing done  Shingles sent to pharmacy F/U 3 months

## 2017-11-10 ENCOUNTER — Encounter: Payer: Self-pay | Admitting: Gastroenterology

## 2017-11-10 ENCOUNTER — Encounter: Payer: Self-pay | Admitting: Family Medicine

## 2017-11-10 DIAGNOSIS — E785 Hyperlipidemia, unspecified: Secondary | ICD-10-CM | POA: Insufficient documentation

## 2017-11-10 LAB — HEPATITIS C ANTIBODY
Hepatitis C Ab: NONREACTIVE
SIGNAL TO CUT-OFF: 0.01 (ref <1.00)

## 2017-11-10 LAB — HIV ANTIBODY (ROUTINE TESTING W REFLEX): HIV 1&2 Ab, 4th Generation: NONREACTIVE

## 2017-11-10 NOTE — Assessment & Plan Note (Signed)
Dietary changes

## 2017-12-02 ENCOUNTER — Ambulatory Visit: Payer: Managed Care, Other (non HMO) | Admitting: Podiatry

## 2017-12-20 ENCOUNTER — Inpatient Hospital Stay: Payer: Managed Care, Other (non HMO)

## 2017-12-20 ENCOUNTER — Inpatient Hospital Stay: Payer: Managed Care, Other (non HMO) | Attending: Genetic Counselor | Admitting: Genetic Counselor

## 2017-12-22 ENCOUNTER — Encounter: Payer: Self-pay | Admitting: Podiatry

## 2017-12-22 ENCOUNTER — Ambulatory Visit: Payer: Managed Care, Other (non HMO)

## 2017-12-22 ENCOUNTER — Other Ambulatory Visit: Payer: Self-pay | Admitting: Podiatry

## 2017-12-22 ENCOUNTER — Ambulatory Visit (INDEPENDENT_AMBULATORY_CARE_PROVIDER_SITE_OTHER): Payer: Managed Care, Other (non HMO)

## 2017-12-22 ENCOUNTER — Ambulatory Visit (INDEPENDENT_AMBULATORY_CARE_PROVIDER_SITE_OTHER): Payer: Managed Care, Other (non HMO) | Admitting: Podiatry

## 2017-12-22 VITALS — BP 109/62 | HR 59

## 2017-12-22 DIAGNOSIS — L84 Corns and callosities: Secondary | ICD-10-CM

## 2017-12-22 DIAGNOSIS — M779 Enthesopathy, unspecified: Secondary | ICD-10-CM

## 2017-12-22 DIAGNOSIS — M79672 Pain in left foot: Secondary | ICD-10-CM

## 2017-12-22 DIAGNOSIS — M79671 Pain in right foot: Secondary | ICD-10-CM

## 2017-12-22 DIAGNOSIS — M7750 Other enthesopathy of unspecified foot: Secondary | ICD-10-CM

## 2017-12-22 MED ORDER — TRIAMCINOLONE ACETONIDE 10 MG/ML IJ SUSP
10.0000 mg | Freq: Once | INTRAMUSCULAR | Status: AC
  Administered 2017-12-22: 10 mg

## 2017-12-22 NOTE — Progress Notes (Signed)
Subjective:   Patient ID: Allison Baker, female   DOB: 62 y.o.   MRN: [id number]   HPI Patient presents stating that she has pain in the bottom of the left foot with lesion formation and went to a doctor who tried to trim it but it did not help her.  She presents with interpreter today and she does not smoke currently and likes to be active   Review of Systems  All other systems reviewed and are negative.       Objective:  Physical Exam  Constitutional: She appears well-developed and well-nourished.  Cardiovascular: Intact distal pulses.  Pulmonary/Chest: Effort normal.  Musculoskeletal: Normal range of motion.  Neurological: She is alert.  Skin: Skin is warm.  Nursing note and vitals reviewed.   Neurovascular status found to be intact muscle strength is adequate range of motion within normal limits with patient found to have inflammation of the plantar fascial left and also has a lesion that has a waxy core within it in the plantar aspect of the left proximal arch.  Patient has good digital perfusion is well oriented x3     Assessment:  Appears to be inflammatory fasciitis with porokeratotic type lesion that is painful     Plan:  H&P x-ray reviewed and today I did careful fascial injection 3 mg Kenalog pyelogram Xylocaine with sterile technique debrided the lesion with no iatrogenic bleeding and a pop applied a small amount of medicine with padding.  Reappoint if symptoms indicate  X-rays were negative for signs of fracture or spur formation

## 2018-01-06 ENCOUNTER — Ambulatory Visit: Payer: Managed Care, Other (non HMO) | Admitting: Cardiology

## 2018-01-07 ENCOUNTER — Encounter: Payer: Self-pay | Admitting: *Deleted

## 2018-01-12 ENCOUNTER — Telehealth: Payer: Self-pay

## 2018-01-12 NOTE — Telephone Encounter (Signed)
Patient No Showed for Pre-Visit. I spoke with the patients daughter and she was going to contact her mother to find out when she could reschedule her appointment. The daughter was informed that she must reschedule before 5:00 Pm today or I will need to cancel her colonoscopy. If patient does not reschedule a no show letter will be mailed.   Riki Sheer, LPN ( [facility] )

## 2018-01-13 ENCOUNTER — Encounter: Payer: Self-pay | Admitting: Family Medicine

## 2018-01-24 NOTE — Progress Notes (Signed)
Cardiology Office Note   Date:  01/25/2018   ID:  Allison Baker, DOB [birth year], MRN [id number]  PCP:  Alycia Rossetti, MD  Cardiologist:   No primary care provider on file. Referring:  Alycia Rossetti, MD  Chief Complaint  Patient presents with  . Palpitations      History of Present Illness: Allison Baker is a 62 y.o. female who is referred by Alycia Rossetti, MD for evaluation of palpitations.   She said she started to have these in December and they became worse in January.  She would notice them more when she was lying at night.  She would have palpitations with several skipped beats and then a pause.  She have to rollover.  She was not having any presyncope or syncope.  She did not notice it with caffeine.  She was able to bring it on with activity.  She said that at some point she changed her diet and started eating healthier and things improved.  She has not had any frank syncope.  She denies any chest pressure, neck or arm discomfort.  She has had no weight gain or edema.  She is very active doing lots of household chores and vacuuming and working as a Training and development officer.  With this she brings on no symptoms.  She is never had any other cardiovascular testing.   Past Medical History:  Diagnosis Date  . Dyslipidemia   . Palpitations     Past Surgical History:  Procedure Laterality Date  . FRACTURE SURGERY  08/2013   HAND  . ORIF WRIST FRACTURE Right 08/24/2013   Procedure: OPEN REDUCTION INTERNAL FIXATION (ORIF) RIGHT WRIST FRACTURE;  Surgeon: Linna Hoff, MD;  Location: [facility];  Service: Orthopedics;  Laterality: Right;  . TUBAL LIGATION       Current Outpatient Medications  Medication Sig Dispense Refill  . CALCIUM PO Take 1 tablet by mouth daily. Reported on 10/17/2015    . fluticasone (FLONASE) 50 MCG/ACT nasal spray Place 2 sprays into both nostrils daily. 16 g 6  . Multiple Vitamin (MULTIVITAMIN WITH MINERALS) TABS tablet Take 1 tablet by mouth daily.     No current  facility-administered medications for this visit.     Allergies:   Patient has no known allergies.    Social History:  The patient  reports that she has been smoking cigarettes.  She has been smoking about 0.00 packs per day. She has never used smokeless tobacco. She reports that she does not drink alcohol or use drugs.   Family History:  The patient's family history includes Cancer in her brother, father, and sister.    ROS:  Please see the history of present illness.   Otherwise, review of systems are positive for none.   All other systems are reviewed and negative.    PHYSICAL EXAM: VS:  BP 104/61   Pulse 61   Wt 135 lb 9.6 oz (61.5 kg)   BMI 24.80 kg/m  , BMI Body mass index is 24.8 kg/m. GENERAL:  Well appearing HEENT:  Pupils equal round and reactive, fundi not visualized, oral mucosa unremarkable NECK:  No jugular venous distention, waveform within normal limits, carotid upstroke brisk and symmetric, no bruits, no thyromegaly LYMPHATICS:  No cervical, inguinal adenopathy LUNGS:  Clear to auscultation bilaterally BACK:  No CVA tenderness CHEST:  Unremarkable HEART:  PMI not displaced or sustained,S1 and S2 within normal limits, no S3, no S4, no clicks, no rubs, no murmurs ABD:  Flat, positive bowel sounds normal in frequency in pitch, no bruits, no rebound, no guarding, no midline pulsatile mass, no hepatomegaly, no splenomegaly EXT:  2 plus pulses throughout, no edema, no cyanosis no clubbing SKIN:  No rashes no nodules NEURO:  Cranial nerves II through XII grossly intact, motor grossly intact throughout PSYCH:  Cognitively intact, oriented to person place and time    EKG:  EKG is not ordered today. The ekg ordered 11/09/17 demonstrates sinus rhythm, rate 56, axis within normal limits, intervals within normal limits, no acute ST-T wave changes.   Recent Labs: 11/03/2017: ALT 25; BUN 13; Creat 0.81; Hemoglobin 13.9; Platelets 311; Potassium 4.8; Sodium 141    Lipid  Panel    Component Value Date/Time   CHOL 196 11/03/2017 0842   TRIG 103 11/03/2017 0842   HDL 51 11/03/2017 0842   CHOLHDL 3.8 11/03/2017 0842   VLDL 21 10/31/2015 0926   LDLCALC 124 (H) 11/03/2017 0842      Wt Readings from Last 3 Encounters:  01/25/18 135 lb 9.6 oz (61.5 kg)  11/09/17 135 lb (61.2 kg)  10/20/17 137 lb (62.1 kg)      Other studies Reviewed: Additional studies/ records that were reviewed today include: EKG. Review of the above records demonstrates:  Please see elsewhere in the note.     ASSESSMENT AND PLAN:    PALPITATIONS: There are mild.  They seem to have abated since she changed her diet.  She has normal physical exam.  She has no other cardiovascular complaints.  I will check a TSH.  However, at this point I do not think any further cardiovascular testing is suggested.  She will let me know if her palpitations return or worsen.  At that point she will probably need a monitor.   Current medicines are reviewed at length with the patient today.  The patient does not have concerns regarding medicines.  The following changes have been made:  no change  Labs/ tests ordered today include:   Orders Placed This Encounter  Procedures  . TSH     Disposition:   FU with me as needed.      Signed, Minus Breeding, MD  01/25/2018 10:49 AM    [facility]

## 2018-01-25 ENCOUNTER — Ambulatory Visit (INDEPENDENT_AMBULATORY_CARE_PROVIDER_SITE_OTHER): Payer: Managed Care, Other (non HMO) | Admitting: Cardiology

## 2018-01-25 ENCOUNTER — Encounter: Payer: Self-pay | Admitting: Cardiology

## 2018-01-25 VITALS — BP 104/61 | HR 61 | Wt 135.6 lb

## 2018-01-25 DIAGNOSIS — R002 Palpitations: Secondary | ICD-10-CM

## 2018-01-25 DIAGNOSIS — R5383 Other fatigue: Secondary | ICD-10-CM

## 2018-01-25 HISTORY — DX: Palpitations: R00.2

## 2018-01-25 LAB — TSH: TSH: 1.9 u[IU]/mL (ref 0.450–4.500)

## 2018-01-25 NOTE — Patient Instructions (Signed)
Medication Instructions:  Continue current medications  If you need a refill on your cardiac medications before your next appointment, please call your pharmacy.  Labwork: None Ordered   Testing/Procedures: TSH Today  Follow-Up: Your physician wants you to follow-up in: As Needed.      Thank you for choosing [facility] at [facility]!!

## 2018-01-26 ENCOUNTER — Encounter: Payer: Managed Care, Other (non HMO) | Admitting: Gastroenterology

## 2018-02-22 ENCOUNTER — Encounter: Payer: Self-pay | Admitting: Family Medicine

## 2018-09-19 ENCOUNTER — Encounter (HOSPITAL_COMMUNITY): Payer: Self-pay

## 2018-09-19 ENCOUNTER — Ambulatory Visit (HOSPITAL_COMMUNITY)
Admission: EM | Admit: 2018-09-19 | Discharge: 2018-09-19 | Disposition: A | Discharge status: Home / Self Care | Payer: Managed Care, Other (non HMO) | Attending: Internal Medicine | Admitting: Internal Medicine

## 2018-09-19 DIAGNOSIS — T148XXA Other injury of unspecified body region, initial encounter: Secondary | ICD-10-CM | POA: Diagnosis not present

## 2018-09-19 MED ORDER — MELOXICAM 7.5 MG PO TABS: 7.5000 mg | ORAL_TABLET | Freq: Every day | ORAL | 0 refills | Status: DC

## 2018-09-19 MED ORDER — CYCLOBENZAPRINE HCL 5 MG PO TABS: 5.0000 mg | ORAL_TABLET | Freq: Every day | ORAL | 0 refills | Status: DC

## 2018-09-19 NOTE — Discharge Instructions (Signed)
Light and regular activity as tolerated.  Meloxicam daily, take with food.  Flexeril at night as needed as a muscle relaxer. May cause drowsiness. Please do not take if driving or drinking alcohol.   If symptoms worsen or do not improve in the next week to return to be seen or to follow up with your PCP.

## 2018-09-19 NOTE — ED Triage Notes (Signed)
Pt presents with back pain on both flank areas.

## 2018-09-19 NOTE — ED Provider Notes (Signed)
[name]    CSN: [id number] Arrival date & time: 09/19/18  1520     History   Chief Complaint Chief Complaint  Patient presents with  . Back Pain    HPI Allison Baker is a 63 y.o. female.   Allison Baker presents with her daughter with complaints of mid back pain which started 2/13 and has persisted. Worse with getting up in the morning. Feels better with stretching of the area. No numbness, tingling or weakness of the arms or legs. Started after she was sweeping. She works as a Veterinary surgeon at [employer]. Pain 8/10 this morning, improves with activity, 5/10 now. Pain doesn't radiate down arms or to legs. No loss of bladder or bowel function. No urinary symptoms. No weakness. Denies any previous similar. Took tylenol which did seem to help. Without contributing medical history.      ROS per HPI.      Past Medical History:  Diagnosis Date  . Dyslipidemia   . Palpitations     Patient Active Problem List   Diagnosis Date Noted  . Palpitation 01/25/2018  . Mild hyperlipidemia 11/10/2017    Past Surgical History:  Procedure Laterality Date  . FRACTURE SURGERY  08/2013   HAND  . ORIF WRIST FRACTURE Right 08/24/2013   Procedure: OPEN REDUCTION INTERNAL FIXATION (ORIF) RIGHT WRIST FRACTURE;  Surgeon: Linna Hoff, MD;  Location: [facility];  Service: Orthopedics;  Laterality: Right;  . TUBAL LIGATION      OB History   No obstetric history on file.      Home Medications    Prior to Admission medications   Medication Sig Start Date End Date Taking? Authorizing Provider  CALCIUM PO Take 1 tablet by mouth daily. Reported on 10/17/2015    [provider]  cyclobenzaprine (FLEXERIL) 5 MG tablet Take 1 tablet (5 mg total) by mouth at bedtime. 09/19/18   Zigmund Gottron, NP  fluticasone (FLONASE) 50 MCG/ACT nasal spray Place 2 sprays into both nostrils daily. 10/31/15   Orlena Sheldon, PA-C  meloxicam (MOBIC) 7.5 MG tablet Take 1 tablet (7.5 mg total) by mouth daily.  09/19/18   Zigmund Gottron, NP  Multiple Vitamin (MULTIVITAMIN WITH MINERALS) TABS tablet Take 1 tablet by mouth daily.    [provider]    Family History Family History  Problem Relation Age of Onset  . Cancer Father        leukemia  . Cancer Sister        ovarian  . Cancer Brother        brain    Social History Social History   Tobacco Use  . Smoking status: Current Every Day Smoker    Packs/day: 0.00    Types: Cigarettes  . Smokeless tobacco: Never Used  Substance Use Topics  . Alcohol use: No  . Drug use: No     Allergies   Patient has no known allergies.   Review of Systems Review of Systems   Physical Exam Triage Vital Signs ED Triage Vitals  Enc Vitals Group     BP 09/19/18 1657 113/70     Pulse Rate 09/19/18 1657 (!) 57     Resp 09/19/18 1657 16     Temp 09/19/18 1657 98.4 F (36.9 C)     Temp Source 09/19/18 1657 Oral     SpO2 09/19/18 1657 100 %     Weight --      Height --  Head Circumference --      Peak Flow --      Pain Score 09/19/18 1658 8     Pain Loc --      Pain Edu? --      Excl. in [place]? --    No data found.  Updated Vital Signs BP 113/70 (BP Location: Right Arm)   Pulse (!) 57   Temp 98.4 F (36.9 C) (Oral)   Resp 16   SpO2 100%   Visual Acuity Right Eye Distance:   Left Eye Distance:   Bilateral Distance:    Right Eye Near:   Left Eye Near:    Bilateral Near:     Physical Exam Constitutional:      General: She is not in acute distress.    Appearance: She is well-developed.  Cardiovascular:     Rate and Rhythm: Normal rate and regular rhythm.     Heart sounds: Normal heart sounds.  Pulmonary:     Effort: Pulmonary effort is normal.     Breath sounds: Normal breath sounds.  Musculoskeletal:     Thoracic back: She exhibits tenderness and pain. She exhibits normal range of motion, no bony tenderness, no swelling, no edema, no deformity, no laceration, no spasm and normal pulse.       Back:      Comments: Pain to mid back musculature, primarily with engagement with use of arms, minimal tenderness on palpation; no bony tenderness; no rib pain; no spinal process tenderness; full ROM of arms and legs; full ROM of arms and legs; strength equal bilaterally; gross sensation intact to upper and lower extremities  Skin:    General: Skin is warm and dry.  Neurological:     Mental Status: She is alert and oriented to person, place, and time.      UC Treatments / Results  Labs (all labs ordered are listed, but only abnormal results are displayed) Labs Reviewed - No data to display  EKG None  Radiology No results found.  Procedures Procedures (including critical care time)  Medications Ordered in UC Medications - No data to display  Initial Impression / Assessment and Plan / UC Course  I have reviewed the triage vital signs and the nursing notes.  Pertinent labs & imaging results that were available during my care of the patient were reviewed by me and considered in my medical decision making (see chart for details).     History and physical consistent with muscle strain; meloxicam provided, muscle relaxer at night. Heat/ice application, massage, light and regular activity. If symptoms worsen or do not improve in the next week to return to be seen or to follow up with PCP.  Patient and daughter verbalized understanding and agreeable to plan.  Ambulatory out of clinic without difficulty.   Final Clinical Impressions(s) / UC Diagnoses   Final diagnoses:  Muscle strain     Discharge Instructions     Light and regular activity as tolerated.  Meloxicam daily, take with food.  Flexeril at night as needed as a muscle relaxer. May cause drowsiness. Please do not take if driving or drinking alcohol.   If symptoms worsen or do not improve in the next week to return to be seen or to follow up with your PCP.     ED Prescriptions    Medication Sig Dispense Auth. Provider    meloxicam (MOBIC) 7.5 MG tablet Take 1 tablet (7.5 mg total) by mouth daily. 20 tablet Zigmund Gottron, NP  cyclobenzaprine (FLEXERIL) 5 MG tablet Take 1 tablet (5 mg total) by mouth at bedtime. 15 tablet Zigmund Gottron, NP     Controlled Substance Prescriptions [place] Controlled Substance Registry consulted? Not Applicable   Zigmund Gottron, NP 09/19/18 1906

## 2018-11-15 ENCOUNTER — Encounter: Payer: Managed Care, Other (non HMO) | Admitting: Family Medicine

## 2019-01-31 ENCOUNTER — Ambulatory Visit (INDEPENDENT_AMBULATORY_CARE_PROVIDER_SITE_OTHER): Payer: Managed Care, Other (non HMO) | Admitting: Family Medicine

## 2019-01-31 ENCOUNTER — Other Ambulatory Visit: Payer: Self-pay

## 2019-01-31 ENCOUNTER — Encounter: Payer: Self-pay | Admitting: Family Medicine

## 2019-01-31 VITALS — BP 130/64 | HR 68 | Temp 98.3°F | Resp 16 | Ht 62.0 in | Wt 140.0 lb

## 2019-01-31 DIAGNOSIS — M546 Pain in thoracic spine: Secondary | ICD-10-CM | POA: Diagnosis not present

## 2019-01-31 DIAGNOSIS — B079 Viral wart, unspecified: Secondary | ICD-10-CM

## 2019-01-31 DIAGNOSIS — G8929 Other chronic pain: Secondary | ICD-10-CM

## 2019-01-31 MED ORDER — MELOXICAM 7.5 MG PO TABS: 7.5000 mg | ORAL_TABLET | Freq: Every day | ORAL | 1 refills | Status: DC | PRN

## 2019-01-31 MED ORDER — CYCLOBENZAPRINE HCL 5 MG PO TABS: 5.0000 mg | ORAL_TABLET | Freq: Two times a day (BID) | ORAL | 1 refills | Status: DC | PRN

## 2019-01-31 NOTE — Patient Instructions (Addendum)
Use CO[place] W on the wart daily  Take the meloxicam as needed and flexeril - [employer] [facility]  / [facility] F/U 2-3 months for Physical

## 2019-01-31 NOTE — Progress Notes (Signed)
   Subjective:    Patient ID: Allison Baker, female    DOB: Jul 02, 1956, 63 y.o.   MRN: [id number]  Patient presents for Mid-Back Pain (radiates to lower back) and R Thumb (has wart like area to R thumb- tender to touch)   Right hand thumb wart ,has returned had cryotherapy back in March 2019 had had lesion for quite some time    Mid back pain on and off for past few months, seen at [facility] in Feb, no imaging done  does not have pain daily  Wanted refill on mobic and flexeril No radiating pain, no change in bowel or bladder, no tingling or numbness in LE   Review Of Systems:  GEN- denies fatigue, fever, weight loss,weakness, recent illness HEENT- denies eye drainage, change in vision, nasal discharge, CVS- denies chest pain, palpitations RESP- denies SOB, cough, wheeze ABD- denies N/V, change in stools, abd pain GU- denies dysuria, hematuria, dribbling, incontinence MSK-+ joint pain, muscle aches, injury Neuro- denies headache, dizziness, syncope, seizure activity       Objective:    BP 130/64   Pulse 68   Temp 98.3 F (36.8 C) (Oral)   Resp 16   Ht 5\' 2"  (1.575 m)   Wt 140 lb (63.5 kg)   SpO2 98%   BMI 25.61 kg/m  GEN- NAD, alert and oriented x3 CVS- RRR, no murmur RESP-CTAB ABD-NABS,soft,NT,ND MSK- Spine NT, no spasm paraspinals, good ROM, neg SLR Neur0- strength in tact LE, normal sensation, normal tone Skin right thumb hypigmented  Small circular raiseed lesion size, NT EXT- No edema Pulses- Radial, DP- 2+        Assessment & Plan:      Problem List Items Addressed This Visit    None    Visit Diagnoses    Chronic midline thoracic back pain    -  Primary   MSK pain, may have some DDD, but no red flags, hold on imaging, refilled mobic and flexeril prn, if pain worsens obtain xray    Relevant Medications   meloxicam (MOBIC) 7.5 MG tablet   cyclobenzaprine (FLEXERIL) 5 MG tablet   Wart on thumb       She did respond to cyrotherapy in the past, I consented with  pt and daughter, speaks spainish, liquid nitrogen applied via spray gun for 10 sec, used 11 blade to debride callused layer then repeated process twice.       Note: This dictation was prepared with Dragon dictation along with smaller phrase technology. Any transcriptional errors that result from this process are unintentional.

## 2019-02-20 ENCOUNTER — Other Ambulatory Visit: Payer: Self-pay

## 2019-02-21 ENCOUNTER — Encounter: Payer: Self-pay | Admitting: Family Medicine

## 2019-02-21 ENCOUNTER — Ambulatory Visit (INDEPENDENT_AMBULATORY_CARE_PROVIDER_SITE_OTHER): Payer: Managed Care, Other (non HMO) | Admitting: Family Medicine

## 2019-02-21 ENCOUNTER — Other Ambulatory Visit: Payer: Self-pay

## 2019-02-21 ENCOUNTER — Ambulatory Visit: Payer: Managed Care, Other (non HMO) | Admitting: Family Medicine

## 2019-02-21 VITALS — BP 118/66 | HR 66 | Temp 97.9°F | Resp 14 | Ht 62.0 in | Wt 140.0 lb

## 2019-02-21 DIAGNOSIS — R0981 Nasal congestion: Secondary | ICD-10-CM

## 2019-02-21 DIAGNOSIS — H60502 Unspecified acute noninfective otitis externa, left ear: Secondary | ICD-10-CM

## 2019-02-21 MED ORDER — NEOMYCIN-POLYMYXIN-HC 3.5-10000-1 OT SOLN: 3.0000 [drp] | Freq: Three times a day (TID) | OTIC | 0 refills | Status: DC

## 2019-02-21 MED ORDER — LORATADINE 10 MG PO TABS: 10.0000 mg | ORAL_TABLET | Freq: Every day | ORAL | 1 refills | Status: DC

## 2019-02-21 NOTE — Patient Instructions (Addendum)
Nasal saline drops for your nose Use claritin 1 tablet daily for 2 weeks  Ear drop 3 times a day for 5 days  F/U Schedule a Monday for wart removal need 30 minutes

## 2019-02-21 NOTE — Progress Notes (Signed)
   Subjective:    Patient ID: Allison Baker, female    DOB: [birth year], 63 y.o.   MRN: [id number]  Patient presents for Ear Pain (x2 weeks- dizzy and R ear whistles)  Pt here with left ear pain, for past 2 weeks, has itching feels like something in canal, minimal pain, hearing is okay. Also occ hears a whistling noise She does get nasal congestion, uses herbals that she drinks which helps, has not used any allergy meds No fever, no cough Had dizzy spell one day     Review Of Systems:  GEN- denies fatigue, fever, weight loss,weakness, recent illness HEENT- denies eye drainage, change in vision, nasal discharge, CVS- denies chest pain, palpitations RESP- denies SOB, cough, wheeze ABD- denies N/V, change in stools, abd pain GU- denies dysuria, hematuria, dribbling, incontinence MSK- denies joint pain, muscle aches, injury Neuro- denies headache, dizziness, syncope, seizure activity       Objective:    BP 118/66   Pulse 66   Temp 97.9 F (36.6 C) (Oral)   Resp 14   Ht 5\' 2"  (1.575 m)   Wt 140 lb (63.5 kg)   SpO2 96%   BMI 25.61 kg/m  GEN- NAD, alert and oriented x3 HEENT- PERRL, EOMI, non injected sclera, pink conjunctiva, MMM, oropharynx clear, mild erythema in left canal, pain with manipulation of pinna, TM in tact bilat, no effusion, nares enlarged turbinates, congested, dry blood in left side, no sinus tenderness  Neck- Supple, no thyromegaly, no LAD CVS- RRR, no murmur RESP-CTAB Neuro-CNII-XII in tact Pulses- Radial 2+        Assessment & Plan:      Problem List Items Addressed This Visit    None    Visit Diagnoses    Acute otitis externa of left ear, unspecified type    -  Primary   Mild OE but pt bothered daily use cortisporin drop, advise no q tips   Nasal congestion       Congestion/allergies could lead to dizzy spell, unclear and short lived, has significant congestion recommend claritin, nasal saline      Note: This dictation was prepared with Dragon  dictation along with smaller Company secretary. Any transcriptional errors that result from this process are unintentional.

## 2019-02-24 ENCOUNTER — Other Ambulatory Visit: Payer: Self-pay

## 2019-02-27 ENCOUNTER — Ambulatory Visit: Payer: Managed Care, Other (non HMO) | Admitting: Family Medicine

## 2019-02-27 ENCOUNTER — Other Ambulatory Visit: Payer: Self-pay

## 2019-02-27 ENCOUNTER — Ambulatory Visit (INDEPENDENT_AMBULATORY_CARE_PROVIDER_SITE_OTHER): Payer: Managed Care, Other (non HMO) | Admitting: Family Medicine

## 2019-02-27 ENCOUNTER — Encounter: Payer: Self-pay | Admitting: Family Medicine

## 2019-02-27 VITALS — BP 118/70 | HR 82 | Temp 98.1°F | Resp 14 | Ht 62.0 in | Wt 140.0 lb

## 2019-02-27 DIAGNOSIS — D489 Neoplasm of uncertain behavior, unspecified: Secondary | ICD-10-CM

## 2019-02-27 MED ORDER — CEPHALEXIN 500 MG PO CAPS: 500.0000 mg | ORAL_CAPSULE | Freq: Two times a day (BID) | ORAL | 0 refills | Status: DC

## 2019-02-27 NOTE — Progress Notes (Signed)
   Subjective:    Patient ID: Allison Baker, female    DOB: [birth year], 63 y.o.   MRN: [id number]  Patient presents for Removal of Wart to Finger  Pt here for presumed wart removal of finger  she would like entire thing removed , we have tried cryotherapy, also used compound W at home with little improvement  She now remembers that she was cutting some shrimp and a piece went into her finger then the spot came up on thumb No pain   daughter here to help translate some knows some english   Review Of Systems:  GEN- denies fatigue, fever, weight loss,weakness, recent illness HEENT- denies eye drainage, change in vision, nasal discharge, CVS- denies chest pain, palpitations RESP- denies SOB, cough, wheeze ABD- denies N/V, change in stools, abd pain GU- denies dysuria, hematuria, dribbling, incontinence MSK- denies joint pain, muscle aches, injury Neuro- denies headache, dizziness, syncope, seizure activity       Objective:    BP 118/70   Pulse 82   Temp 98.1 F (36.7 C) (Oral)   Resp 14   Ht 5\' 2"  (1.575 m)   Wt 140 lb (63.5 kg)   SpO2 99%   BMI 25.61 kg/m  GEN- NAD, alert and oriented x3 Right thumb circular hyperpigmented raised  callus lesion, size of small eraser head < 0.5cm NT, no erythem MSK- FROM THumb  Procedure- Punch biopsy Procedure explained to patient questions answered benefits and risks discussed verbal consent obtained. Antiseptic- betadine Anesthesia-lidocaine 1% Initially shave biopsy performed but then noted lesion still at center, not classic black dot beneath top shaved area, 22mm punch performed   2, single interrupted 3-0 sutures placed Minimal blood loss,  FROM thumb at end of procedure  Patient tolerated procedure well Bandage applied       Assessment & Plan:      Problem List Items Addressed This Visit    None    Visit Diagnoses    Neoplasm, uncertain whether benign or malignant    -  Primary   initially felt to be wart, sent for  pathology, suture removal in 7-10 days   Relevant Orders   Pathology Report ([facility])      Note: This dictation was prepared with Dragon dictation along with smaller phrase technology. Any transcriptional errors that result from this process are unintentional.

## 2019-02-27 NOTE — Patient Instructions (Addendum)
F/U Monday for suture removal

## 2019-03-01 LAB — PATHOLOGY REPORT

## 2019-03-01 LAB — TISSUE SPECIMEN

## 2019-03-06 ENCOUNTER — Encounter: Payer: Self-pay | Admitting: Family Medicine

## 2019-03-06 ENCOUNTER — Ambulatory Visit (INDEPENDENT_AMBULATORY_CARE_PROVIDER_SITE_OTHER): Payer: Managed Care, Other (non HMO) | Admitting: Family Medicine

## 2019-03-06 ENCOUNTER — Other Ambulatory Visit: Payer: Self-pay

## 2019-03-06 VITALS — BP 120/64 | HR 76 | Temp 97.9°F | Resp 16 | Ht 62.0 in | Wt 140.0 lb

## 2019-03-06 DIAGNOSIS — H65192 Other acute nonsuppurative otitis media, left ear: Secondary | ICD-10-CM

## 2019-03-06 DIAGNOSIS — B079 Viral wart, unspecified: Secondary | ICD-10-CM | POA: Diagnosis not present

## 2019-03-06 DIAGNOSIS — H9190 Unspecified hearing loss, unspecified ear: Secondary | ICD-10-CM

## 2019-03-06 DIAGNOSIS — L299 Pruritus, unspecified: Secondary | ICD-10-CM | POA: Diagnosis not present

## 2019-03-06 NOTE — Patient Instructions (Signed)
F/U as previous for Physical   

## 2019-03-06 NOTE — Progress Notes (Signed)
   Subjective:    Patient ID: Allison Baker, female    DOB: 13-May-1956, 63 y.o.   MRN: [id number]  Patient presents for Suture / Staple Removal and Ear Issues  Pt here for suture removal, for lesion on right thumb- Sutures actually came out in shower last Friday Biopsy showed wart  She was taken out of work  Given Keflex for 3 days   She continues to difficulty with her ears.  I treated her for otitis externa of the left ear few weeks ago at her visit last week she states that her ears are fine.  She also was given Claritin for nasal congestion and itching in the ears.  This did help with the nasal congestion but she still has itching bilaterally in the ear she hears a swishing sound like water.  She is no longer using the drops.  She does not feel like the Claritin helped with the overall ear sensation.  It often wakes her up at nighttime.  She would like to have this further evaluated.  She denies any pain in the ear.   Review Of Systems:  GEN- denies fatigue, fever, weight loss,weakness, recent illness HEENT- denies eye drainage, change in vision, nasal discharge, CVS- denies chest pain, palpitations RESP- denies SOB, cough, wheeze ABD- denies N/V, change in stools, abd pain GU- denies dysuria, hematuria, dribbling, incontinence MSK- denies joint pain, muscle aches, injury Neuro- denies headache, dizziness, syncope, seizure activity       Objective:    BP 120/64   Pulse 76   Temp 97.9 F (36.6 C) (Oral)   Resp 16   Ht 5\' 2"  (1.575 m)   Wt 140 lb (63.5 kg)   SpO2 98%   BMI 25.61 kg/m  GEN- NAD, alert and oriented x3 HEENT- PERRL, EOMI, non injected sclera, pink conjunctiva, MMM, oropharynx clear, TM decreased light reflex left side, canal clear, no erythema, Right TM in tact, normal light reflex, hearing grossly in tact , no sinus tenderness  Neck- Supple, CVS- RRR, no murmur RESP-CTAB Skin- right thumb- healing punch biopsy, no bleeding NT, FROM thumb- ligaments in tact          Assessment & Plan:      Problem List Items Addressed This Visit    None    Visit Diagnoses    Viral wart on finger    -  Primary   Sutures came out on there own, no infection, healing well, can keep covered with bandaid and neosporin, release to work   Itching of ear       Tried otic drops, anti-histamine, referral to ENT, has dull reflex on left side, query small effusion but expect anti-histamine to help   Relevant Orders   Ambulatory referral to ENT   Perceived hearing changes       Relevant Orders   Ambulatory referral to ENT   Acute effusion of left ear       Relevant Orders   Ambulatory referral to ENT      Note: This dictation was prepared with Dragon dictation along with smaller phrase technology. Any transcriptional errors that result from this process are unintentional.

## 2019-03-13 ENCOUNTER — Other Ambulatory Visit: Payer: Self-pay

## 2019-03-13 ENCOUNTER — Ambulatory Visit (INDEPENDENT_AMBULATORY_CARE_PROVIDER_SITE_OTHER): Payer: Managed Care, Other (non HMO) | Admitting: Otolaryngology

## 2019-03-13 DIAGNOSIS — H9313 Tinnitus, bilateral: Secondary | ICD-10-CM | POA: Diagnosis not present

## 2019-03-13 DIAGNOSIS — H903 Sensorineural hearing loss, bilateral: Secondary | ICD-10-CM

## 2019-03-13 DIAGNOSIS — H9209 Otalgia, unspecified ear: Secondary | ICD-10-CM

## 2019-04-04 ENCOUNTER — Ambulatory Visit (INDEPENDENT_AMBULATORY_CARE_PROVIDER_SITE_OTHER): Payer: Managed Care, Other (non HMO) | Admitting: Family Medicine

## 2019-04-04 ENCOUNTER — Encounter: Payer: Self-pay | Admitting: Family Medicine

## 2019-04-04 ENCOUNTER — Other Ambulatory Visit: Payer: Self-pay

## 2019-04-04 VITALS — BP 110/74 | HR 66 | Temp 98.3°F | Resp 16 | Ht 62.0 in | Wt 137.0 lb

## 2019-04-04 DIAGNOSIS — Z23 Encounter for immunization: Secondary | ICD-10-CM | POA: Diagnosis not present

## 2019-04-04 DIAGNOSIS — E785 Hyperlipidemia, unspecified: Secondary | ICD-10-CM

## 2019-04-04 DIAGNOSIS — Z0001 Encounter for general adult medical examination with abnormal findings: Secondary | ICD-10-CM | POA: Diagnosis not present

## 2019-04-04 DIAGNOSIS — E559 Vitamin D deficiency, unspecified: Secondary | ICD-10-CM | POA: Diagnosis not present

## 2019-04-04 DIAGNOSIS — Z0000 Encounter for general adult medical examination without abnormal findings: Secondary | ICD-10-CM

## 2019-04-04 DIAGNOSIS — Z1239 Encounter for other screening for malignant neoplasm of breast: Secondary | ICD-10-CM

## 2019-04-04 NOTE — Patient Instructions (Addendum)
Schedule your mammogram - CALL NUMBER ON PINK SHEET Schedule the cologuard - GREEN SHEET  Shingles vaccine sent to pharmacy - YOU CAN ASK ABOUT COST WHEN YOU GET THERE We will call with lab results  Schedule a PAP Smear

## 2019-04-04 NOTE — Progress Notes (Signed)
   Subjective:    Patient ID: Allison Baker, female    DOB: 07-18-1956, 63 y.o.   MRN: [id number]  Patient presents for Annual Exam (does not want pap at this time)  Pt here for CPE, medications and history reviewed   Pt here for CPE, seen by ENT after last visi t  Wears contact lenses, and reading glasses- [facility] [place]- Oct 1st   [place] , has appt next week    She does have some difficulty emptying her bladder at times. NO dysuria, occ leaks with sneezing  no abd vaginal bleeding Last PAP Smear 3 years ago - she does not want to do this today     Review Of Systems:  GEN- denies fatigue, fever, weight loss,weakness, recent illness HEENT- denies eye drainage, change in vision, nasal discharge, CVS- denies chest pain, palpitations RESP- denies SOB, cough, wheeze ABD- denies N/V, change in stools, abd pain GU- denies dysuria, hematuria, dribbling, incontinence MSK- denies joint pain, muscle aches, injury Neuro- denies headache, dizziness, syncope, seizure activity       Objective:    BP 110/74 (BP Location: Right Arm, Patient Position: Sitting, Cuff Size: Normal)   Pulse 66   Temp 98.3 F (36.8 C)   Resp 16   Ht 5\' 2"  (1.575 m)   Wt 137 lb (62.1 kg)   SpO2 98%   BMI 25.06 kg/m  GEN- NAD, alert and oriented x3 HEENT- PERRL, EOMI, non injected sclera, pink conjunctiva, MMM, oropharynx clear, TM clear no effusion, nares clear  Neck- Supple, no thyromegaly CVS- RRR, no murmur RESP-CTAB ABD-NABS,soft,NT,ND EXT- No edema Pulses- Radial, DP- 2+    Fall/audit C/depression screen negative     Assessment & Plan:      Problem List Items Addressed This Visit      Unprioritized   Mild hyperlipidemia    Other Visit Diagnoses    Routine general medical examination at a health care facility    -  Primary   CPE done, pt to schedule mammogram, cologuard for colon cancer screen, Flu shot given, shingrix to pharmacy, fasting labs, return for GYN exam will  check bladder then for prolapse   Relevant Orders   CBC with Differential/Platelet   Comprehensive metabolic panel   Lipid panel   TSH   Need for immunization against influenza       Relevant Orders   Flu Vaccine QUAD 36+ mos IM (Completed)   Vitamin D deficiency       Relevant Orders   Vitamin D, 25-hydroxy      Note: This dictation was prepared with Dragon dictation along with smaller phrase technology. Any transcriptional errors that result from this process are unintentional.

## 2019-04-05 ENCOUNTER — Telehealth: Payer: Self-pay

## 2019-04-05 LAB — COMPREHENSIVE METABOLIC PANEL
AG Ratio: 1.6 (calc) (ref 1.0–2.5)
ALT: 15 U/L (ref 6–29)
AST: 18 U/L (ref 10–35)
Albumin: 4.4 g/dL (ref 3.6–5.1)
Alkaline phosphatase (APISO): 90 U/L (ref 37–153)
BUN: 15 mg/dL (ref 7–25)
CO2: 27 mmol/L (ref 20–32)
Calcium: 10 mg/dL (ref 8.6–10.4)
Chloride: 106 mmol/L (ref 98–110)
Creat: 0.82 mg/dL (ref 0.50–0.99)
Globulin: 2.8 g/dL (calc) (ref 1.9–3.7)
Glucose, Bld: 84 mg/dL (ref 65–99)
Potassium: 5.1 mmol/L (ref 3.5–5.3)
Sodium: 142 mmol/L (ref 135–146)
Total Bilirubin: 0.5 mg/dL (ref 0.2–1.2)
Total Protein: 7.2 g/dL (ref 6.1–8.1)

## 2019-04-05 LAB — CBC WITH DIFFERENTIAL/PLATELET
Absolute Monocytes: 619 cells/uL (ref 200–950)
Basophils Absolute: 48 cells/uL (ref 0–200)
Basophils Relative: 0.7 %
Eosinophils Absolute: 211 cells/uL (ref 15–500)
Eosinophils Relative: 3.1 %
HCT: 43 % (ref 35.0–45.0)
Hemoglobin: 14 g/dL (ref 11.7–15.5)
Lymphs Abs: 2754 cells/uL (ref 850–3900)
MCH: 30 pg (ref 27.0–33.0)
MCHC: 32.6 g/dL (ref 32.0–36.0)
MCV: 92.1 fL (ref 80.0–100.0)
MPV: 11 fL (ref 7.5–12.5)
Monocytes Relative: 9.1 %
Neutro Abs: 3169 cells/uL (ref 1500–7800)
Neutrophils Relative %: 46.6 %
Platelets: 274 10*3/uL (ref 140–400)
RBC: 4.67 10*6/uL (ref 3.80–5.10)
RDW: 13 % (ref 11.0–15.0)
Total Lymphocyte: 40.5 %
WBC: 6.8 10*3/uL (ref 3.8–10.8)

## 2019-04-05 LAB — VITAMIN D 25 HYDROXY (VIT D DEFICIENCY, FRACTURES): Vit D, 25-Hydroxy: 22 ng/mL — ABNORMAL LOW (ref 30–100)

## 2019-04-05 LAB — LIPID PANEL
Cholesterol: 213 mg/dL — ABNORMAL HIGH (ref <200)
HDL: 57 mg/dL (ref >=50)
LDL Cholesterol (Calc): 134 mg/dL (calc) — ABNORMAL HIGH
Non-HDL Cholesterol (Calc): 156 mg/dL (calc) — ABNORMAL HIGH (ref <130)
Total CHOL/HDL Ratio: 3.7 (calc) (ref <5.0)
Triglycerides: 110 mg/dL (ref <150)

## 2019-04-05 LAB — TSH: TSH: 2.06 mIU/L (ref 0.40–4.50)

## 2019-04-05 MED ORDER — ZOSTER VAC RECOMB ADJUVANTED 50 MCG/0.5ML IM SUSR: 0.5000 mL | Freq: Once | INTRAMUSCULAR | 0 refills | Status: AC

## 2019-04-05 NOTE — Telephone Encounter (Signed)
Shingrix sent to pharmacy.

## 2019-07-19 ENCOUNTER — Other Ambulatory Visit: Payer: Managed Care, Other (non HMO) | Admitting: Family Medicine

## 2019-08-16 ENCOUNTER — Other Ambulatory Visit: Payer: Managed Care, Other (non HMO) | Admitting: Family Medicine

## 2019-10-23 ENCOUNTER — Ambulatory Visit: Payer: Managed Care, Other (non HMO) | Admitting: Family Medicine

## 2019-10-24 ENCOUNTER — Ambulatory Visit (INDEPENDENT_AMBULATORY_CARE_PROVIDER_SITE_OTHER): Payer: 59 | Admitting: Family Medicine

## 2019-10-24 ENCOUNTER — Other Ambulatory Visit: Payer: Self-pay

## 2019-10-24 ENCOUNTER — Encounter: Payer: Self-pay | Admitting: Family Medicine

## 2019-10-24 VITALS — BP 112/62 | HR 58 | Temp 97.9°F | Resp 16 | Ht 62.0 in | Wt 141.0 lb

## 2019-10-24 DIAGNOSIS — D224 Melanocytic nevi of scalp and neck: Secondary | ICD-10-CM

## 2019-10-24 DIAGNOSIS — B078 Other viral warts: Secondary | ICD-10-CM | POA: Diagnosis not present

## 2019-10-24 NOTE — Progress Notes (Signed)
   Subjective:    Patient ID: Allison Baker, female    DOB: [birth year], 64 y.o.   MRN: [id number]  Patient presents for Knot to Scalp (reports that she has 2 bumps to back of head- reports surronding skin itches at times) and Wart (has wart to L and R hand)  Patient here with her daughter who also helps her interpret though she is able to understand [place]. She has 2 lesions that she noted in her scalp when she was doing a massage.  She does not have any pain.  She has not noted any drainage from the lesions.  Unknown of any change in size  She has 2 warts lesions on her hands.  She has had warts before.  She would like to have them frozen off again.  In the past we had to do mild debridement before cryotherapy in order to get the previous wart to resolve completely she would like to repeat this procedure.  Review Of Systems:  GEN- denies fatigue, fever, weight loss,weakness, recent illness HEENT- denies eye drainage, change in vision, nasal discharge, CVS- denies chest pain, palpitations RESP- denies SOB, cough, wheeze ABD- denies N/V, change in stools, abd pain GU- denies dysuria, hematuria, dribbling, incontinence MSK- denies joint pain, muscle aches, injury Neuro- denies headache, dizziness, syncope, seizure activity       Objective:    BP 112/62   Pulse (!) 58   Temp 97.9 F (36.6 C) (Temporal)   Resp 16   Ht 5\' 2"  (1.575 m)   Wt 141 lb (64 kg)   SpO2 98%   BMI 25.79 kg/m  GEN- NAD, alert and oriented x3 Neck- Supple, no LAD  Skin- SCALP to flesh toned nevus 1 in the left posterior scalp behind the ear the second 1 in the occipital region nontender no erythema no fluctuance.  2 warts 1 in the webspace right hand between the thumb and index finger second palmar surface   Verbal consent obtained for cyrotherapy and debridement with 11 blade scalpel superficially       Assessment & Plan:      Problem List Items Addressed This Visit    None    Visit Diagnoses    Multiple benign nevi of scalp    -  Primary   Will monitor for now , recheck at CPE in April,   Other viral warts       s/p cyrotherapy after mild debridement of superficial surface, black dots noted beneath skin removed, liquid nitrogen via spray gun 5-10 sec x 2 passes No bleeding bandage applied Recheck at next visit,can use wart OTC med until next visit       Note: This dictation was prepared with Dragon dictation along with smaller phrase technology. Any transcriptional errors that result from this process are unintentional.

## 2019-10-24 NOTE — Patient Instructions (Signed)
F/u As previous

## 2019-11-15 ENCOUNTER — Other Ambulatory Visit: Payer: Managed Care, Other (non HMO) | Admitting: Family Medicine

## 2019-12-04 ENCOUNTER — Ambulatory Visit (INDEPENDENT_AMBULATORY_CARE_PROVIDER_SITE_OTHER): Payer: 59 | Admitting: Nurse Practitioner

## 2019-12-04 ENCOUNTER — Other Ambulatory Visit: Payer: Self-pay

## 2019-12-04 ENCOUNTER — Encounter: Payer: Self-pay | Admitting: Nurse Practitioner

## 2019-12-04 VITALS — BP 102/64 | HR 61 | Temp 97.4°F | Resp 15 | Ht 62.0 in | Wt 142.1 lb

## 2019-12-04 DIAGNOSIS — L03012 Cellulitis of left finger: Secondary | ICD-10-CM | POA: Diagnosis not present

## 2019-12-04 MED ORDER — MUPIROCIN CALCIUM 2 % EX CREA: 1.0000 "application " | TOPICAL_CREAM | Freq: Two times a day (BID) | CUTANEOUS | 0 refills | Status: DC

## 2019-12-04 MED ORDER — MUPIROCIN 2 % EX OINT: 1.0000 "application " | TOPICAL_OINTMENT | Freq: Two times a day (BID) | CUTANEOUS | 0 refills | Status: DC

## 2019-12-04 NOTE — Patient Instructions (Signed)
Warm soap water soaks twice a day, then dry, then apply Bactroban ointment.  Follow up for worsening or non resolving symptoms.   Avoid picking or squeezing or candle wax.

## 2019-12-04 NOTE — Progress Notes (Signed)
Established Patient Office Visit  Subjective:  Patient ID: Allison Baker, female    DOB: [birth year]  Age: 64 y.o. MRN: [id number]  CC:  Chief Complaint  Patient presents with  . Hand Pain    Patient has swelling to left middle finger, believes something is in it.    HPI Allison Baker is a 64 year old female accompanied by her daughter today presenting for pain to her left fourth fingernail. Sunday she pulled a hangnail off, the next day she began to experience pain, denied redness, drainage, or foul odor. She melted a candle and dripped the melted hot wax on the area, she reported that this did relieve the pain temporarily. She has tried no other treatments.   Past Medical History:  Diagnosis Date  . Dyslipidemia   . Palpitations     Past Surgical History:  Procedure Laterality Date  . FRACTURE SURGERY  08/2013   HAND  . ORIF WRIST FRACTURE Right 08/24/2013   Procedure: OPEN REDUCTION INTERNAL FIXATION (ORIF) RIGHT WRIST FRACTURE;  Surgeon: Linna Hoff, MD;  Location: [facility];  Service: Orthopedics;  Laterality: Right;  . TUBAL LIGATION      Family History  Problem Relation Age of Onset  . Cancer Father        leukemia  . Cancer Sister        ovarian  . Cancer Brother        brain    Social History   Socioeconomic History  . Marital status: Single    Spouse name: Not on file  . Number of children: Not on file  . Years of education: Not on file  . Highest education level: Not on file  Occupational History  . Not on file  Tobacco Use  . Smoking status: Current Every Day Smoker    Packs/day: 0.00    Types: Cigarettes  . Smokeless tobacco: Never Used  Substance and Sexual Activity  . Alcohol use: No  . Drug use: No  . Sexual activity: Not Currently  Other Topics Concern  . Not on file  Social History Narrative   Lives daughter.     Social Determinants of Health   Financial Resource Strain:   . Difficulty of Paying Living Expenses:   Food Insecurity:   .  Worried About Charity fundraiser in the Last Year:   . Arboriculturist in the Last Year:   Transportation Needs:   . Film/video editor (Medical):   Marland Kitchen Lack of Transportation (Non-Medical):   Physical Activity:   . Days of Exercise per Week:   . Minutes of Exercise per Session:   Stress:   . Feeling of Stress :   Social Connections:   . Frequency of Communication with Friends and Family:   . Frequency of Social Gatherings with Friends and Family:   . Attends Religious Services:   . Active Member of Clubs or Organizations:   . Attends Archivist Meetings:   Marland Kitchen Marital Status:   Intimate Partner Violence:   . Fear of Current or Ex-Partner:   . Emotionally Abused:   Marland Kitchen Physically Abused:   . Sexually Abused:     Outpatient Medications Prior to Visit  Medication Sig Dispense Refill  . loratadine (CLARITIN) 10 MG tablet Take 10 mg by mouth daily.    . Multiple Vitamin (MULTIVITAMIN WITH MINERALS) TABS tablet Take 1 tablet by mouth daily.     No facility-administered medications prior to visit.  No Known Allergies  ROS Review of Systems  All other systems reviewed and are negative.     Objective:    Physical Exam  Constitutional: She is oriented to person, place, and time. She appears well-developed and well-nourished.  HENT:  Head: Normocephalic.  Eyes: Pupils are equal, round, and reactive to light. Conjunctivae and EOM are normal.  Neck: No JVD present.  Cardiovascular: Normal rate.  Pulmonary/Chest: Effort normal.  Abdominal: She exhibits no distension.  Musculoskeletal:        General: Normal range of motion.     Cervical back: Normal range of motion and neck supple.  Neurological: She is alert and oriented to person, place, and time.  Skin: Skin is warm and dry.  Paronychia to left fourth finger closed without redness or drainage, tender to touch  Psychiatric: She has a normal mood and affect. Her behavior is normal. Thought content normal.    Nursing note and vitals reviewed.   BP 102/64   Pulse 61   Temp (!) 97.4 F (36.3 C) (Temporal)   Resp 15   Ht 5\' 2"  (1.575 m)   Wt 142 lb 2 oz (64.5 kg)   SpO2 97%   BMI 26.00 kg/m  Wt Readings from Last 3 Encounters:  12/04/19 142 lb 2 oz (64.5 kg)  10/24/19 141 lb (64 kg)  04/04/19 137 lb (62.1 kg)     Health Maintenance Due  Topic Date Due  . COVID-19 Vaccine (1) Never done  . MAMMOGRAM  Never done  . COLONOSCOPY  Never done  . PAP SMEAR-Modifier  10/31/2018  . DEXA SCAN  Never done  . PNA vac Low Risk Adult (1 of 2 - PCV13) Never done    There are no preventive care reminders to display for this patient.  Lab Results  Component Value Date   TSH 2.06 04/04/2019   Lab Results  Component Value Date   WBC 6.8 04/04/2019   HGB 14.0 04/04/2019   HCT 43.0 04/04/2019   MCV 92.1 04/04/2019   PLT 274 04/04/2019   Lab Results  Component Value Date   NA 142 04/04/2019   K 5.1 04/04/2019   CO2 27 04/04/2019   GLUCOSE 84 04/04/2019   BUN 15 04/04/2019   CREATININE 0.82 04/04/2019   BILITOT 0.5 04/04/2019   ALKPHOS 89 10/31/2015   AST 18 04/04/2019   ALT 15 04/04/2019   PROT 7.2 04/04/2019   ALBUMIN 4.2 10/31/2015   CALCIUM 10.0 04/04/2019   Lab Results  Component Value Date   CHOL 213 (H) 04/04/2019   Lab Results  Component Value Date   HDL 57 04/04/2019   Lab Results  Component Value Date   LDLCALC 134 (H) 04/04/2019   Lab Results  Component Value Date   TRIG 110 04/04/2019   Lab Results  Component Value Date   CHOLHDL 3.7 04/04/2019   No results found for: HGBA1C    Assessment & Plan:   Problem List Items Addressed This Visit    None    Visit Diagnoses    Paronychia of finger, left    -  Primary   Relevant Medications   mupirocin cream (BACTROBAN) 2 %     Alternative to Neosporin as you would like prescription Bactroban cream sent electronically to pharmacy.,  Warm soap water soaks twice a day, then dry, then apply Bactroban  cream.  Follow up for worsening or non resolving symptoms.   Avoid picking or squeezing or candle wax  Follow-up: Return if symptoms worsen or fail to improve.    Annie Main, FNP

## 2019-12-12 ENCOUNTER — Encounter: Payer: Self-pay | Admitting: Family Medicine

## 2019-12-12 ENCOUNTER — Other Ambulatory Visit: Payer: Self-pay

## 2019-12-12 ENCOUNTER — Ambulatory Visit (INDEPENDENT_AMBULATORY_CARE_PROVIDER_SITE_OTHER): Payer: 59 | Admitting: Family Medicine

## 2019-12-12 VITALS — BP 110/68 | HR 82 | Temp 98.1°F | Resp 14 | Ht 62.0 in | Wt 143.0 lb

## 2019-12-12 DIAGNOSIS — L03012 Cellulitis of left finger: Secondary | ICD-10-CM | POA: Diagnosis not present

## 2019-12-12 MED ORDER — SULFAMETHOXAZOLE-TRIMETHOPRIM 800-160 MG PO TABS: 1.0000 | ORAL_TABLET | Freq: Two times a day (BID) | ORAL | 0 refills | Status: DC

## 2019-12-12 NOTE — Patient Instructions (Signed)
Take antibiotics as prescribed Soak in epson salt for 3 days Use soap and water and bandage to keep clean  F/U as needed

## 2019-12-12 NOTE — Progress Notes (Signed)
   Subjective:    Patient ID: Allison Baker, female    DOB: January 12, 1956, 64 y.o.   MRN: [id number]  Patient presents for Nail Infection (L 3rd finger nail- was using Bactroban, but has stopped )  Pt daughter present who helps interpret- spanish speaking,with some English  Pt here for infection of left 3rd digit, seen 1 week ago by NP, she had pulled some dead skin off, then it swelled up and became very painful, treated for probable paronychia with epson salt and bactroban. She states cream did not help, had throbbing in finger for days. NO fever, no chills She used some type of natural balm which helped the pain a little Still very senitive  Upon evaluated finger has hyperpigmentation below skin medial border of nail , TTP at this region, she states it turned that color after she was picking at it    Review Of Systems:  GEN- denies fatigue, fever, weight loss,weakness, recent illness, CVS- denies chest pain, palpitations RESP- denies SOB, cough, wheeze MSK- +joint pain, muscle aches, injury        Objective:    BP 110/68   Pulse 82   Temp 98.1 F (36.7 C) (Temporal)   Resp 14   Ht 5\' 2"  (1.575 m)   Wt 143 lb (64.9 kg)   SpO2 97%   BMI 26.16 kg/m  GEN- NAD, alert and oriented x3 Skin- Left hand middle finger medial nail fold swelling, with hypigmentation , TTP, mild fluctuance   MSK- FROM hand, able to make fist, FROM left middle finger Pulse- radial 2+   Procedure- Incision and Drainage Procedure explained to patient questions answered benefits and risks discussed verbal  consent obtained. Antiseptic-Betadine Anesthesia-lidocaine 1% , NO EPI approx 0.5cc J Incision to skin medially to nail ,pus expressed ,  Culture taken Minimal blood loss Patient tolerated procedure well Bandage applied with triple antibiotic ointment          Assessment & Plan:      Problem List Items Addressed This Visit    None    Visit Diagnoses    Acute paronychia of finger, left    -   Primary   s/p I&D, pt tolerated well, start Bactrim, soak epson salt for next 3 days, bandage with ointment, after care discussed . culture sent    Relevant Orders   WOUND CULTURE      Note: This dictation was prepared with Dragon dictation along with smaller phrase technology. Any transcriptional errors that result from this process are unintentional.

## 2019-12-15 LAB — WOUND CULTURE
MICRO NUMBER:: 10463877
SPECIMEN QUALITY:: ADEQUATE

## 2020-02-16 ENCOUNTER — Other Ambulatory Visit: Payer: Self-pay | Admitting: Family Medicine

## 2020-04-01 ENCOUNTER — Other Ambulatory Visit: Payer: Self-pay | Admitting: Family Medicine

## 2020-04-01 ENCOUNTER — Ambulatory Visit (INDEPENDENT_AMBULATORY_CARE_PROVIDER_SITE_OTHER): Payer: 59 | Admitting: Family Medicine

## 2020-04-01 ENCOUNTER — Other Ambulatory Visit: Payer: Self-pay

## 2020-04-01 ENCOUNTER — Encounter: Payer: Self-pay | Admitting: Family Medicine

## 2020-04-01 VITALS — BP 128/68 | HR 82 | Temp 98.3°F | Resp 14 | Ht 62.0 in | Wt 137.0 lb

## 2020-04-01 DIAGNOSIS — F5109 Other insomnia not due to a substance or known physiological condition: Secondary | ICD-10-CM | POA: Diagnosis not present

## 2020-04-01 DIAGNOSIS — R634 Abnormal weight loss: Secondary | ICD-10-CM

## 2020-04-01 DIAGNOSIS — G8929 Other chronic pain: Secondary | ICD-10-CM

## 2020-04-01 DIAGNOSIS — R1031 Right lower quadrant pain: Secondary | ICD-10-CM

## 2020-04-01 LAB — URINALYSIS, ROUTINE W REFLEX MICROSCOPIC
Bilirubin Urine: NEGATIVE
Glucose, UA: NEGATIVE
Hgb urine dipstick: NEGATIVE
Leukocytes,Ua: NEGATIVE
Nitrite: NEGATIVE
Protein, ur: NEGATIVE
Specific Gravity, Urine: 1.025 (ref 1.001–1.03)
pH: 5.5 (ref 5.0–8.0)

## 2020-04-01 MED ORDER — TRAZODONE HCL 50 MG PO TABS: 25.0000 mg | ORAL_TABLET | Freq: Every evening | ORAL | 1 refills | Status: DC | PRN

## 2020-04-01 NOTE — Progress Notes (Signed)
   Subjective:    Patient ID: Allison Baker, female    DOB: 1956-05-27, 64 y.o.   MRN: [id number]  Patient presents for R Sided Pain (intermittent pain in R side)  Patient here with intermittent right lower quadrant pain for the past 6 weeks.  However in the past week it has become more constant.  She does get some discomfort if she eats greasy or fried foods but no nausea vomiting associated.  She does not have any bowel changes.  She points to her appendix region.  She states that she has had gallbladder and appendix issues in her family.  She denies any urinary symptoms no vaginal symptoms.  She takes over-the-counter if needed for pain.  Today the pain is not as severe.  Also admits to significant stressors with regards to her eye surgery.  She is seeing the specialist tomorrow.  She had lid correction however she still has ptosis of the left eye.  Is been very worried about her eyes and has not been sleeping very well.  She request something to help with sleep and her nerves.  Trying melatonin and herbal teas but they are no longer helping her rest.  Note  patient here today with her daughter Review Of Systems:  GEN- denies fatigue, fever, weight loss,weakness, recent illness HEENT- denies eye drainage, change in vision, nasal discharge, CVS- denies chest pain, palpitations RESP- denies SOB, cough, wheeze ABD- denies N/V, change in stools, abd pain GU- denies dysuria, hematuria, dribbling, incontinence MSK- denies joint pain, muscle aches, injury Neuro- denies headache, dizziness, syncope, seizure activity       Objective:    BP 128/68   Pulse 82   Temp 98.3 F (36.8 C) (Temporal)   Resp 14   Ht 5\' 2"  (1.575 m)   Wt 137 lb (62.1 kg)   SpO2 98%   BMI 25.06 kg/m  GEN- NAD, alert and oriented x3 HEENT- PERRL, EOMI, non injected sclera, pink conjunctiva, MMM, oropharynx clear mild ptosis of left eye lid  Neck- Supple, no thyromegaly CVS- RRR, no  murmur RESP-CTAB ABD-NABS,soft,mild TTP RLQ, no rebound, no guarding, no mass palpated, no CVA tenderness Psych- normal affect and mood  EXT- No edema Pulses- Radial, DP- 2+        Assessment & Plan:      Problem List Items Addressed This Visit    None    Visit Diagnoses    Chronic RLQ pain    -  Primary   Chronic intermittant pain, with some weight loss, obtain CT abd, check labs, UA, her exam shows minimal tenderness, so difficult to tease out, need CT r/u appendix inflammation or possibly focal colitis No RUQ pain one exam but has weight loss and family history of gallbladder issue So will check for stones Also consider ovarian pathology in RLQ   Relevant Medications   traZODone (DESYREL) 50 MG tablet   Other Relevant Orders   CBC with Differential/Platelet   Comprehensive metabolic panel   Lipase   Urinalysis, Routine w reflex microscopic   Weight loss       Relevant Orders   TSH   Situational insomnia       Trazodone prn       Note: This dictation was prepared with Dragon dictation along with smaller phrase technology. Any transcriptional errors that result from this process are unintentional.

## 2020-04-01 NOTE — Patient Instructions (Addendum)
Take trazodone at bedtime 1/2  CT scan to be done  We will call with lab results F/U pending results

## 2020-04-02 LAB — CBC WITH DIFFERENTIAL/PLATELET
Absolute Monocytes: 682 cells/uL (ref 200–950)
Basophils Absolute: 57 cells/uL (ref 0–200)
Basophils Relative: 0.8 %
Eosinophils Absolute: 241 cells/uL (ref 15–500)
Eosinophils Relative: 3.4 %
HCT: 42.1 % (ref 35.0–45.0)
Hemoglobin: 14.1 g/dL (ref 11.7–15.5)
Lymphs Abs: 3351 cells/uL (ref 850–3900)
MCH: 30.5 pg (ref 27.0–33.0)
MCHC: 33.5 g/dL (ref 32.0–36.0)
MCV: 91.1 fL (ref 80.0–100.0)
MPV: 10.5 fL (ref 7.5–12.5)
Monocytes Relative: 9.6 %
Neutro Abs: 2769 cells/uL (ref 1500–7800)
Neutrophils Relative %: 39 %
Platelets: 325 10*3/uL (ref 140–400)
RBC: 4.62 10*6/uL (ref 3.80–5.10)
RDW: 13.2 % (ref 11.0–15.0)
Total Lymphocyte: 47.2 %
WBC: 7.1 10*3/uL (ref 3.8–10.8)

## 2020-04-02 LAB — LIPASE: Lipase: 19 U/L (ref 7–60)

## 2020-04-02 LAB — COMPREHENSIVE METABOLIC PANEL
AG Ratio: 2.1 (calc) (ref 1.0–2.5)
ALT: 14 U/L (ref 6–29)
AST: 17 U/L (ref 10–35)
Albumin: 4.6 g/dL (ref 3.6–5.1)
Alkaline phosphatase (APISO): 84 U/L (ref 37–153)
BUN: 15 mg/dL (ref 7–25)
CO2: 27 mmol/L (ref 20–32)
Calcium: 9.5 mg/dL (ref 8.6–10.4)
Chloride: 105 mmol/L (ref 98–110)
Creat: 0.85 mg/dL (ref 0.50–0.99)
Globulin: 2.2 g/dL (calc) (ref 1.9–3.7)
Glucose, Bld: 113 mg/dL — ABNORMAL HIGH (ref 65–99)
Potassium: 4.2 mmol/L (ref 3.5–5.3)
Sodium: 140 mmol/L (ref 135–146)
Total Bilirubin: 0.6 mg/dL (ref 0.2–1.2)
Total Protein: 6.8 g/dL (ref 6.1–8.1)

## 2020-04-02 LAB — TSH: TSH: 2.34 mIU/L (ref 0.40–4.50)

## 2020-04-15 ENCOUNTER — Ambulatory Visit
Admission: RE | Admit: 2020-04-15 | Discharge: 2020-04-15 | Disposition: A | Discharge status: Home / Self Care | Payer: 59 | Source: Ambulatory Visit | Attending: Family Medicine | Admitting: Family Medicine

## 2020-04-15 DIAGNOSIS — R1031 Right lower quadrant pain: Secondary | ICD-10-CM

## 2020-04-15 DIAGNOSIS — R634 Abnormal weight loss: Secondary | ICD-10-CM

## 2020-04-15 DIAGNOSIS — G8929 Other chronic pain: Secondary | ICD-10-CM

## 2020-04-17 ENCOUNTER — Other Ambulatory Visit: Payer: Self-pay | Admitting: *Deleted

## 2020-04-17 DIAGNOSIS — K7689 Other specified diseases of liver: Secondary | ICD-10-CM

## 2020-07-02 ENCOUNTER — Encounter: Payer: Self-pay | Admitting: Gastroenterology

## 2020-07-02 ENCOUNTER — Ambulatory Visit (INDEPENDENT_AMBULATORY_CARE_PROVIDER_SITE_OTHER): Payer: 59 | Admitting: Gastroenterology

## 2020-07-02 VITALS — BP 108/68 | HR 66 | Ht 62.0 in | Wt 136.5 lb

## 2020-07-02 DIAGNOSIS — K7689 Other specified diseases of liver: Secondary | ICD-10-CM | POA: Diagnosis not present

## 2020-07-02 DIAGNOSIS — R1031 Right lower quadrant pain: Secondary | ICD-10-CM

## 2020-07-02 MED ORDER — PLENVU 140 G PO SOLR: 1.0000 | ORAL | 0 refills | Status: DC

## 2020-07-02 NOTE — Progress Notes (Signed)
[facility] Consult Note:  History: Allison Baker 07/02/2020  Referring provider: Alycia Rossetti, MD  Reason for consult/chief complaint: CT Scan (9/13) and Abdominal Pain (Right side discomfort )   Subjective  HPI:  This is a very pleasant 64 year old woman accompanied by her daughter (who provides Spanish translation) and referred by primary care for right lower quadrant pain.  It started in June or July, and has been behaving in a similar fashion over that time.  It is a dull intermittent ache that she often notices more in the late evening when she is relaxed laying in bed.  It is nonradiating with no clear triggers or relieving factors.  Not worse with movement, no change in bowel habits or rectal bleeding.  The discomfort may be gone for many days at a time.  She denies nausea vomiting or dysphagia.  Allison Baker is concerned it may be gallbladder related with some family history of that.  Note from PCP visit 04/01/2020 reviewed.  Describes about 6 weeks of right lower quadrant pain, reported family history of gallbladder problems, patient reported stressors related to ongoing difficulty after a procedure for left eyelid droop.  Labs and imaging ordered. No prior colon cancer screening.  PCP ordered Cologuard last year but it was not done. ROS:  Review of Systems  Constitutional: Negative for appetite change and unexpected weight change.  HENT: Negative for mouth sores and voice change.   Eyes: Negative for pain and redness.       Left eyelid droop  Respiratory: Negative for cough and shortness of breath.   Cardiovascular: Negative for chest pain and palpitations.  Genitourinary: Negative for dysuria and hematuria.  Musculoskeletal: Negative for arthralgias and myalgias.  Skin: Negative for pallor and rash.  Neurological: Negative for weakness and headaches.  Hematological: Negative for adenopathy.   Overall health very good.  Past Medical History: Past  Medical History:  Diagnosis Date  . Dyslipidemia   . Palpitations      Past Surgical History: Past Surgical History:  Procedure Laterality Date  . FRACTURE SURGERY  08/2013   HAND  . ORIF WRIST FRACTURE Right 08/24/2013   Procedure: OPEN REDUCTION INTERNAL FIXATION (ORIF) RIGHT WRIST FRACTURE;  Surgeon: Linna Hoff, MD;  Location: [facility];  Service: Orthopedics;  Laterality: Right;  . TUBAL LIGATION       Family History: Family History  Problem Relation Age of Onset  . Cancer Father        leukemia  . Cancer Sister        ovarian  . Cancer Brother        brain  . Colon cancer Neg Hx   . Esophageal cancer Neg Hx     Social History: Social History   Socioeconomic History  . Marital status: Single    Spouse name: Not on file  . Number of children: Not on file  . Years of education: Not on file  . Highest education level: Not on file  Occupational History  . Not on file  Tobacco Use  . Smoking status: Current Some Day Smoker    Packs/day: 0.00    Types: Cigarettes  . Smokeless tobacco: Never Used  Vaping Use  . Vaping Use: Never used  Substance and Sexual Activity  . Alcohol use: No  . Drug use: No  . Sexual activity: Not Currently  Other Topics Concern  . Not on file  Social History Narrative   Lives daughter.     Social  Determinants of Health   Financial Resource Strain:   . Difficulty of Paying Living Expenses: Not on file  Food Insecurity:   . Worried About Charity fundraiser in the Last Year: Not on file  . Ran Out of Food in the Last Year: Not on file  Transportation Needs:   . Lack of Transportation (Medical): Not on file  . Lack of Transportation (Non-Medical): Not on file  Physical Activity:   . Days of Exercise per Week: Not on file  . Minutes of Exercise per Session: Not on file  Stress:   . Feeling of Stress : Not on file  Social Connections:   . Frequency of Communication with Friends and Family: Not on file  . Frequency of Social  Gatherings with Friends and Family: Not on file  . Attends Religious Services: Not on file  . Active Member of Clubs or Organizations: Not on file  . Attends Archivist Meetings: Not on file  . Marital Status: Not on file    Allergies: No Known Allergies  Outpatient Meds: Current Outpatient Medications  Medication Sig Dispense Refill  . loratadine (CLARITIN) 10 MG tablet Take 10 mg by mouth as needed.     . Multiple Vitamin (MULTIVITAMIN WITH MINERALS) TABS tablet Take 1 tablet by mouth daily.    Marland Kitchen neomycin-polymyxin b-dexamethasone (MAXITROL) 3.5-10000-0.1 SUSP Place 1 drop into the right eye 3 (three) times daily as needed.    . meloxicam (MOBIC) 7.5 MG tablet TAKE 1 TABLET(7.5 MG) BY MOUTH DAILY AS NEEDED FOR PAIN (Patient not taking: Reported on 07/02/2020) 30 tablet 1   No current facility-administered medications for this visit.      ___________________________________________________________________ Objective   Exam:  BP 108/68   Pulse 66   Ht 5\' 2"  (1.575 m)   Wt 136 lb 8 oz (61.9 kg)   BMI 24.97 kg/m    General: Well-appearing  Eyes: sclera anicteric, no redness.  Left ptosis  ENT: oral mucosa moist without lesions, no cervical or supraclavicular lymphadenopathy  CV: RRR without murmur, S1/S2, no JVD, no peripheral edema  Resp: clear to auscultation bilaterally, normal RR and effort noted  GI: soft, no tenderness, with active bowel sounds. No guarding or palpable organomegaly noted.  Skin; warm and dry, no rash or jaundice noted  Neuro: awake, alert and oriented x 3. Normal gross motor function and fluent speech  Labs:  CBC Latest Ref Rng & Units 04/01/2020 04/04/2019 11/03/2017  WBC 3.8 - 10.8 Thousand/uL 7.1 6.8 7.4  Hemoglobin 11.7 - 15.5 g/dL 14.1 14.0 13.9  Hematocrit 35 - 45 % 42.1 43.0 41.1  Platelets 140 - 400 Thousand/uL 325 274 311   CMP Latest Ref Rng & Units 04/01/2020 04/04/2019 11/03/2017  Glucose 65 - 99 mg/dL 113(H) 84 82  BUN 7 -  25 mg/dL 15 15 13   Creatinine 0.50 - 0.99 mg/dL 0.85 0.82 0.81  Sodium 135 - 146 mmol/L 140 142 141  Potassium 3.5 - 5.3 mmol/L 4.2 5.1 4.8  Chloride 98 - 110 mmol/L 105 106 106  CO2 20 - 32 mmol/L 27 27 29   Calcium 8.6 - 10.4 mg/dL 9.5 10.0 9.7  Total Protein 6.1 - 8.1 g/dL 6.8 7.2 6.7  Total Bilirubin 0.2 - 1.2 mg/dL 0.6 0.5 0.5  Alkaline Phos 33 - 130 U/L - - -  AST 10 - 35 U/L 17 18 21   ALT 6 - 29 U/L 14 15 25      Radiologic Studies:   CLINICAL  DATA:  Right lower quadrant abdominal pain 1 week.   EXAM: CT ABDOMEN AND PELVIS WITHOUT CONTRAST   TECHNIQUE: Multidetector CT imaging of the abdomen and pelvis was performed following the standard protocol without IV contrast.   COMPARISON:  None.   FINDINGS: Lower chest: The lung bases are clear without focal nodule, mass, or airspace disease. Heart size is normal. No significant pleural or pericardial effusion is present.   Hepatobiliary: Multiple water density lesions are scattered throughout the liver. The largest is in the left lobe measuring 3.9 cm on image 14 of series 2. The common bile duct and gallbladder are within normal limits. No solid lesions are present.   Pancreas: Unremarkable. No pancreatic ductal dilatation or surrounding inflammatory changes.   Spleen: Normal in size without focal abnormality.   Adrenals/Urinary Tract: Kidneys and ureters are within normal limits. No stone or mass lesion is present. No obstruction is present. The urinary bladder is within normal limits.   Stomach/Bowel: The stomach duodenum are within normal limits. Oral contrast is present in the distal small bowel. The ileocolic junction is within normal limits. The appendix is visualized and normal. The ascending and transverse colon are within normal limits. Descending and sigmoid colon are normal.   Vascular/Lymphatic: No significant vascular findings are present. No enlarged abdominal or pelvic lymph nodes.     Reproductive: Uterus and bilateral adnexa are unremarkable.   Other: No abdominal wall hernia or abnormality. No abdominopelvic ascites.   Musculoskeletal: Slight degenerative anterolisthesis present at L4-5. Superior endplate Schmorl's node is present at L2. Facet degenerative changes are present in the lower lumbar spine. No acute abnormalities are present. Pelvis is within normal limits. The hips are located and normal bilaterally.   IMPRESSION: 1. No acute or focal lesion to explain the patient's right lower quadrant abdominal pain. 2. The appendix is visualized and normal. 3. Multiple water density lesions scattered throughout the liver are most consistent with hepatic cysts. 4. Degenerative changes in the lower lumbar spine.     Electronically Signed   By: San Morelle M.D.   On: 04/15/2020 16:32    Assessment: Encounter Diagnoses  Name Primary?  . RLQ abdominal pain Yes  . Hepatic cyst     Difficult to characterize, sounds benign.  No associated digestive symptoms, no findings on CT scan to explain the pain.  No ascites, areas of inflammation or mass. Incidental small benign hepatic cysts, she and her daughter were reassured.  Plan:  Colonoscopy.  She is agreeable after discussion of procedure and risks.  The benefits and risks of the planned procedure were described in detail with the patient or (when appropriate) their health care proxy.  Risks were outlined as including, but not limited to, bleeding, infection, perforation, adverse medication reaction leading to cardiac or pulmonary decompensation, pancreatitis (if ERCP).  The limitation of incomplete mucosal visualization was also discussed.  No guarantees or warranties were given. Low pretest suspicion that the colonoscopy will be revealing for source of pain, but is also overdue for colorectal cancer screening.   Thank you for the courtesy of this consult.  Please call me with any questions or  concerns.  Nelida Meuse III  CC: Referring provider noted above

## 2020-07-02 NOTE — Patient Instructions (Signed)
If you are age 65 or older, your body mass index should be between 23-30. Your Body mass index is 24.97 kg/m. If this is out of the aforementioned range listed, please consider follow up with your Primary Care Provider.  If you are age 66 or younger, your body mass index should be between 19-25. Your Body mass index is 24.97 kg/m. If this is out of the aformentioned range listed, please consider follow up with your Primary Care Provider.   You have been scheduled for a colonoscopy. Please follow written instructions given to you at your visit today.  Please pick up your prep supplies at the pharmacy within the next 1-3 days. If you use inhalers (even only as needed), please bring them with you on the day of your procedure.  Follow up pending the results of your Colonoscopy.  It was a pleasure to see you today!  Dr. Loletha Carrow

## 2020-07-17 ENCOUNTER — Other Ambulatory Visit: Payer: Self-pay

## 2020-07-17 ENCOUNTER — Ambulatory Visit (INDEPENDENT_AMBULATORY_CARE_PROVIDER_SITE_OTHER): Payer: 59 | Admitting: Family Medicine

## 2020-07-17 DIAGNOSIS — Z23 Encounter for immunization: Secondary | ICD-10-CM | POA: Diagnosis not present

## 2020-07-17 NOTE — Progress Notes (Signed)
Pt received high dose flu in l deltoid, tolerated well

## 2020-09-02 ENCOUNTER — Ambulatory Visit (AMBULATORY_SURGERY_CENTER): Payer: 59 | Admitting: Gastroenterology

## 2020-09-02 ENCOUNTER — Encounter: Payer: Self-pay | Admitting: Gastroenterology

## 2020-09-02 ENCOUNTER — Other Ambulatory Visit: Payer: Self-pay

## 2020-09-02 VITALS — BP 91/70 | HR 55 | Temp 96.8°F | Resp 12 | Ht 62.0 in | Wt 136.0 lb

## 2020-09-02 DIAGNOSIS — K6389 Other specified diseases of intestine: Secondary | ICD-10-CM

## 2020-09-02 DIAGNOSIS — K5730 Diverticulosis of large intestine without perforation or abscess without bleeding: Secondary | ICD-10-CM

## 2020-09-02 DIAGNOSIS — D122 Benign neoplasm of ascending colon: Secondary | ICD-10-CM

## 2020-09-02 DIAGNOSIS — R1031 Right lower quadrant pain: Secondary | ICD-10-CM

## 2020-09-02 MED ORDER — SODIUM CHLORIDE 0.9 % IV SOLN: 500.0000 mL | Freq: Once | INTRAVENOUS | Status: DC

## 2020-09-02 NOTE — Patient Instructions (Signed)
Impression/Recommendations:  Polyp and diverticulosis handouts given to patient.  Resume previous diet. Continue present medications. Await pathology results.  Repeat colonoscopy recommended for surveillance.  Date to be determined after pathology results reviewed.  Use Miralaxx 1 capful (17 grams) in 8 oz. Of water by mouth as needed.  USTED TUVO UN PROCEDIMIENTO ENDOSCPICO HOY EN EL [facility]:   Lea el informe del procedimiento que se le entreg para cualquier pregunta especfica sobre lo que se Primary school teacher.  Si el informe del examen no responde a sus preguntas, por favor llame a su gastroenterlogo para aclararlo.  Si usted solicit que no se le den [employer] de lo que se Estate manager/land agent en su procedimiento al [employer] va a cuidar, entonces el informe del procedimiento se ha incluido en un sobre sellado para que usted lo revise despus cuando le sea ms conveniente.   LO QUE PUEDE ESPERAR: Algunas sensaciones de hinchazn en el abdomen.  Puede tener ms gases de lo normal.  El caminar puede ayudarle a eliminar el aire que se le puso en el tracto gastrointestinal durante el procedimiento y reducir la hinchazn.  Si le hicieron una endoscopia inferior (como una colonoscopia o una sigmoidoscopia flexible), podra notar manchas de sangre en las heces fecales o en el papel higinico.  Si se someti a una preparacin intestinal para su procedimiento, es posible que no tenga una evacuacin intestinal normal durante [employer].   Tenga en cuenta:  Es posible que note un poco de irritacin y congestin en la nariz o algn drenaje.  Esto es debido al oxgeno [employer] durante su procedimiento.  No hay que preocuparse y esto debe desaparecer ms o Scientist, research (medical).   SNTOMAS PARA REPORTAR INMEDIATAMENTE:  Despus de una endoscopia inferior (colonoscopia o sigmoidoscopia flexible):  Cantidades excesivas de sangre en las heces fecales  Sensibilidad significativa o  empeoramiento de los dolores abdominales   Hinchazn aguda del abdomen que antes no tena   Fiebre de 100F o ms  Para asuntos urgentes o de Freight forwarder, puede comunicarse con un gastroenterlogo a cualquier hora llamando al [phone].  DIETA:  Recomendamos una comida pequea al principio, pero luego puede continuar con su dieta normal.  Tome muchos lquidos, Teacher, adult education las bebidas alcohlicas durante 24 horas.    ACTIVIDAD:  Debe planear tomarse las cosas con calma por el resto del da y no debe CONDUCIR ni usar maquinaria pesada Programmer, applications (debido a los medicamentos de sedacin utilizados durante el examen).     SEGUIMIENTO: Nuestro personal llamar al nmero que aparece en su historial al siguiente da hbil de su procedimiento para ver cmo se siente y para responder cualquier pregunta o inquietud que pueda tener con respecto a la informacin que se le dio despus del procedimiento. Si no podemos contactarle, le dejaremos un mensaje.  Sin embargo, si se siente bien y no tiene Paediatric nurse, no es necesario que nos devuelva la llamada.  Asumiremos que ha regresado a sus actividades diarias normales sin incidentes. Si se le tomaron algunas biopsias, le contactaremos por telfono o por carta en las prximas 3 semanas.  Si no ha sabido [employer] biopsias en el transcurso de 3 semanas, por favor llmenos al [phone].   FIRMAS/CONFIDENCIALIDAD: Usted y/o el acompaante que le cuide han firmado documentos que se ingresarn en su historial mdico electrnico.  Estas firmas atestiguan el hecho de que la informacin anterior

## 2020-09-02 NOTE — Op Note (Signed)
[facility] Patient Name: Allison Baker Procedure Date: 09/02/2020 11:24 AM MRN: [id number] Endoscopist: Mallie Mussel L. Loletha Carrow , MD Age: 65 Referring MD:  Date of Birth: [birth year] Gender: Female Account #: [id number] Procedure:                Colonoscopy Indications:              Abdominal pain in the right lower quadrant                            (approximately 9 months, intermittent, dull. Normal                            CTAP) Medicines:                Monitored Anesthesia Care Procedure:                Pre-Anesthesia Assessment:                           - Prior to the procedure, a History and Physical                            was performed, and patient medications and                            allergies were reviewed. The patient's tolerance of                            previous anesthesia was also reviewed. The risks                            and benefits of the procedure and the sedation                            options and risks were discussed with the patient.                            All questions were answered, and informed consent                            was obtained. Prior Anticoagulants: The patient has                            taken no previous anticoagulant or antiplatelet                            agents. ASA Grade Assessment: II - A patient with                            mild systemic disease. After reviewing the risks                            and benefits, the patient was deemed in  satisfactory condition to undergo the procedure.                           After obtaining informed consent, the colonoscope                            was passed under direct vision. Throughout the                            procedure, the patient's blood pressure, pulse, and                            oxygen saturations were monitored continuously. The                            Olympus CF-HQ190L [id number]) Colonoscope was                             introduced through the anus and advanced to the the                            terminal ileum, with identification of the                            appendiceal orifice and IC valve. The colonoscopy                            was performed without difficulty. The patient                            tolerated the procedure well. The quality of the                            bowel preparation was excellent. The terminal                            ileum, ileocecal valve, appendiceal orifice, and                            rectum were photographed. Scope In: 11:28:31 AM Scope Out: 11:44:20 AM Scope Withdrawal Time: 0 hours 12 minutes 21 seconds  Total Procedure Duration: 0 hours 15 minutes 49 seconds  Findings:                 The perianal and digital rectal examinations were                            normal.                           The terminal ileum appeared normal.                           A few small-mouthed diverticula were found in the  right colon.                           A 5 mm polyp was found in the ascending colon. The                            polyp was flat. The polyp was removed with a cold                            snare. Resection and retrieval were complete.                           Melanosis was found in the entire colon.                           The exam was otherwise without abnormality on                            direct and retroflexion views. Complications:            No immediate complications. Estimated Blood Loss:     Estimated blood loss was minimal. Impression:               - The examined portion of the ileum was normal.                           - Diverticulosis in the right colon.                           - One 5 mm polyp in the ascending colon, removed                            with a cold snare. Resected and retrieved.                           - Melanosis in the colon.                           - The  examination was otherwise normal on direct                            and retroflexion views.                           No GI cause for RLQ pain seen. Perhaps related to                            constipation, given finding of melanosis suggesting                            laxative use. Recommendation:           - Patient has a contact number available for                            emergencies.  The signs and symptoms of potential                            delayed complications were discussed with the                            patient. Return to normal activities tomorrow.                            Written discharge instructions were provided to the                            patient.                           - Resume previous diet.                           - Continue present medications.                           - Await pathology results.                           - Repeat colonoscopy is recommended for                            surveillance. The colonoscopy date will be                            determined after pathology results from today's                            exam become available for review.                           - Miralax 1 capful (17 grams) in 8 ounces of water                            PO PRN. [name] L. Myrtie Neither, MD 09/02/2020 11:49:41 AM This report has been signed electronically.

## 2020-09-02 NOTE — Progress Notes (Signed)
PT taken to PACU. Monitors in place. VSS. Report given to RN. 

## 2020-09-02 NOTE — Progress Notes (Signed)
Called to room to assist during endoscopic procedure.  Patient ID and intended procedure confirmed with present staff. Received instructions for my participation in the procedure from the performing physician.  

## 2020-09-04 ENCOUNTER — Telehealth: Payer: Self-pay

## 2020-09-04 NOTE — Telephone Encounter (Signed)
LVM

## 2020-09-10 ENCOUNTER — Encounter: Payer: Self-pay | Admitting: Gastroenterology

## 2020-11-29 ENCOUNTER — Emergency Department (HOSPITAL_COMMUNITY)
Admission: EM | Admit: 2020-11-29 | Discharge: 2020-11-30 | Disposition: A | Discharge status: Home / Self Care | Payer: 59 | Attending: Emergency Medicine | Admitting: Emergency Medicine

## 2020-11-29 ENCOUNTER — Other Ambulatory Visit: Payer: Self-pay

## 2020-11-29 DIAGNOSIS — Z23 Encounter for immunization: Secondary | ICD-10-CM | POA: Insufficient documentation

## 2020-11-29 DIAGNOSIS — S61215A Laceration without foreign body of left ring finger without damage to nail, initial encounter: Secondary | ICD-10-CM | POA: Insufficient documentation

## 2020-11-29 DIAGNOSIS — S60945A Unspecified superficial injury of left ring finger, initial encounter: Secondary | ICD-10-CM | POA: Diagnosis present

## 2020-11-29 DIAGNOSIS — W260XXA Contact with knife, initial encounter: Secondary | ICD-10-CM | POA: Insufficient documentation

## 2020-11-29 DIAGNOSIS — F17210 Nicotine dependence, cigarettes, uncomplicated: Secondary | ICD-10-CM | POA: Insufficient documentation

## 2020-11-30 MED ORDER — LIDOCAINE HCL (PF) 1 % IJ SOLN
5.0000 mL | Freq: Once | INTRAMUSCULAR | Status: AC
  Administered 2020-11-30: 5 mL
  Filled 2020-11-30: qty 5

## 2020-11-30 MED ORDER — TETANUS-DIPHTH-ACELL PERTUSSIS 5-2.5-18.5 LF-MCG/0.5 IM SUSY
0.5000 mL | PREFILLED_SYRINGE | Freq: Once | INTRAMUSCULAR | Status: AC
  Administered 2020-11-30: 0.5 mL via INTRAMUSCULAR
  Filled 2020-11-30: qty 0.5

## 2020-11-30 MED ORDER — "THROMBI-PAD 3""X3"" EX PADS"
1.0000 | MEDICATED_PAD | Freq: Once | CUTANEOUS | Status: AC
  Administered 2020-11-30: 1 via TOPICAL
  Filled 2020-11-30: qty 1

## 2020-11-30 NOTE — ED Provider Notes (Addendum)
[facility] EMERGENCY DEPARTMENT Provider Note   CSN: [id number] Arrival date & time: 11/29/20  2250     History Chief Complaint  Patient presents with  . Laceration    Pt c/o laceration to left hand, 4th digit (ring finger).     Allison Baker is a 65 y.o. female.  Patient to ED with laceration to left 4th finger caused when using a knife earlier that slipped. No other injury. She reports she came because the bleeding was continuous.   The history is provided by the patient. No language interpreter was used.  Laceration      Past Medical History:  Diagnosis Date  . Dyslipidemia   . Palpitations     Patient Active Problem List   Diagnosis Date Noted  . Palpitation 01/25/2018  . Mild hyperlipidemia 11/10/2017    Past Surgical History:  Procedure Laterality Date  . FRACTURE SURGERY  08/2013   HAND  . ORIF WRIST FRACTURE Right 08/24/2013   Procedure: OPEN REDUCTION INTERNAL FIXATION (ORIF) RIGHT WRIST FRACTURE;  Surgeon: Linna Hoff, MD;  Location: [facility];  Service: Orthopedics;  Laterality: Right;  . TUBAL LIGATION       OB History   No obstetric history on file.     Family History  Problem Relation Age of Onset  . Cancer Father        leukemia  . Cancer Sister        ovarian  . Cancer Brother        brain  . Colon cancer Neg Hx   . Esophageal cancer Neg Hx   . Rectal cancer Neg Hx   . Stomach cancer Neg Hx     Social History   Tobacco Use  . Smoking status: Current Some Day Smoker    Packs/day: 0.00    Types: Cigarettes  . Smokeless tobacco: Never Used  . Tobacco comment: maybe 1-2 per day  Vaping Use  . Vaping Use: Never used  Substance Use Topics  . Alcohol use: No  . Drug use: No    Home Medications Prior to Admission medications   Medication Sig Start Date End Date Taking? Authorizing Provider  loratadine (CLARITIN) 10 MG tablet Take 10 mg by mouth as needed.  11/08/19   [provider]  meloxicam (MOBIC) 7.5  MG tablet TAKE 1 TABLET(7.5 MG) BY MOUTH DAILY AS NEEDED FOR PAIN Patient not taking: No sig reported 02/19/20   Alycia Rossetti, MD  Multiple Vitamin (MULTIVITAMIN WITH MINERALS) TABS tablet Take 1 tablet by mouth daily.    [provider]  neomycin-polymyxin b-dexamethasone (MAXITROL) 3.5-10000-0.1 SUSP Place 1 drop into the right eye 3 (three) times daily as needed. 06/18/20   [provider]    Allergies    Patient has no known allergies.  Review of Systems   Review of Systems  Musculoskeletal:       See HPI.  Skin: Positive for wound.  Neurological: Negative for numbness.    Physical Exam Updated Vital Signs BP 125/76 (BP Location: Right Arm)   Pulse 77   Temp 98 F (36.7 C) (Oral)   Resp 16   SpO2 100%   Physical Exam Vitals and nursing note reviewed.  Constitutional:      Appearance: She is well-developed.  Pulmonary:     Effort: Pulmonary effort is normal.  Musculoskeletal:     Cervical back: Normal range of motion.  Skin:    General: Skin is warm and dry.  Comments: Linear laceration to left 4th finger pad.   Neurological:     Mental Status: She is alert and oriented to person, place, and time.     ED Results / Procedures / Treatments   Labs (all labs ordered are listed, but only abnormal results are displayed) Labs Reviewed - No data to display  EKG None  Radiology No results found.  Procedures .Marland KitchenLaceration Repair  Date/Time: 11/30/2020 2:55 AM Performed by: Charlann Lange, PA-C Authorized by: Charlann Lange, PA-C   Consent:    Consent obtained:  Verbal   Consent given by:  Patient Universal protocol:    Procedure explained and questions answered to patient or proxy's satisfaction: yes     Immediately prior to procedure, a time out was called: yes     Patient identity confirmed:  Verbally with patient Anesthesia:    Anesthesia method:  Local infiltration   Local anesthetic:  Lidocaine 1% w/o epi Laceration details:     Location:  Finger   Finger location:  L ring finger   Length (cm):  1.5 Exploration:    Hemostasis achieved with:  Direct pressure   Wound extent: no foreign bodies/material noted     Contaminated: no   Treatment:    Area cleansed with:  Povidone-iodine and saline   Debridement:  None   Undermining:  None Skin repair:    Repair method: Avulsion lac - no sutures. Repair type:    Repair type:  Simple Post-procedure details:    Procedure completion:  Tolerated well, no immediate complications Comments:     Thrombi-Pad applied to avulsion to control bleeding and bandage applied.      Medications Ordered in ED Medications  lidocaine (PF) (XYLOCAINE) 1 % injection 5 mL (has no administration in time range)    ED Course  I have reviewed the triage vital signs and the nursing notes.  Pertinent labs & imaging results that were available during my care of the patient were reviewed by me and considered in my medical decision making (see chart for details).    MDM Rules/Calculators/A&P                          Patient to ED with simple laceration of left 4th finger, repaired as per above procedure note.    Final Clinical Impression(s) / ED Diagnoses Final diagnoses:  None   1. Finger laceration  Rx / DC Orders ED Discharge Orders    None       Charlann Lange, PA-C 11/30/20 0256    Charlann Lange, PA-C 11/30/20 0102    Veryl Speak, MD 11/30/20 [phone]

## 2020-11-30 NOTE — ED Notes (Signed)
Follow up appts reviewed w/ pt. Denies questions or concerns @ this time. Education on s/s of worsening and when to return. Evalcuuated for L to L ring finger, site was unable to be sutured. Thrombi pad applied.  Left w/ even and steady gait. NAD noted.  VSS.

## 2020-11-30 NOTE — Discharge Instructions (Addendum)
Leave the bandage on for 24 hours and then change it. If there is further bleeding, reapply the Thrombi-Pad again for another 24 hours.

## 2020-11-30 NOTE — ED Notes (Addendum)
Suture cart and equipment @ bedside

## 2020-12-02 ENCOUNTER — Telehealth: Payer: Self-pay

## 2020-12-02 NOTE — Telephone Encounter (Signed)
Transition Care Management Follow-up Telephone Call  Date of discharge and from where: 11/30/2020 from [facility]  How have you been since you were released from the hospital? Pt stated that she is feeling well.   Any questions or concerns? No Pt was minorly concerned about preventing infection.  Items Reviewed:  Did the pt receive and understand the discharge instructions provided? Yes   Medications obtained and verified? Yes   Other? No   Any new allergies since your discharge? No   Dietary orders reviewed? n/a  Do you have support at home? Yes   Functional Questionnaire: (I = Independent and D = Dependent) ADLs: I  Bathing/Dressing- I  Meal Prep- I  Eating- I  Maintaining continence- I  Transferring/Ambulation- I  Managing Meds- I  Follow up appointments reviewed:   PCP Hospital f/u appt confirmed? No  Pt will make an appt with PCP to have laceration of finger reevaluated.   [facility] f/u appt confirmed? No    Are transportation arrangements needed? No   If their condition worsens, is the pt aware to call PCP or go to the Emergency Dept.? Yes  Was the patient provided with contact information for the PCP's office or ED? Yes  Was to pt encouraged to call back with questions or concerns? Yes

## 2021-01-14 ENCOUNTER — Other Ambulatory Visit: Payer: Self-pay

## 2021-01-14 ENCOUNTER — Ambulatory Visit (INDEPENDENT_AMBULATORY_CARE_PROVIDER_SITE_OTHER): Payer: 59 | Admitting: Family Medicine

## 2021-01-14 ENCOUNTER — Encounter: Payer: Self-pay | Admitting: Family Medicine

## 2021-01-14 VITALS — BP 118/64 | HR 69 | Temp 97.3°F | Ht 62.0 in | Wt 140.0 lb

## 2021-01-14 DIAGNOSIS — J329 Chronic sinusitis, unspecified: Secondary | ICD-10-CM

## 2021-01-14 DIAGNOSIS — J310 Chronic rhinitis: Secondary | ICD-10-CM | POA: Diagnosis not present

## 2021-01-14 MED ORDER — AMOXICILLIN 875 MG PO TABS: 875.0000 mg | ORAL_TABLET | Freq: Two times a day (BID) | ORAL | 0 refills | Status: AC

## 2021-01-14 MED ORDER — BENZONATATE 200 MG PO CAPS: 200.0000 mg | ORAL_CAPSULE | Freq: Three times a day (TID) | ORAL | 0 refills | Status: DC | PRN

## 2021-01-14 NOTE — Progress Notes (Signed)
Subjective:    Patient ID: Allison Baker, female    DOB: 04-05-1956, 65 y.o.   MRN: [id number]  HPI  Patient reports a 1 week history of cough.  The cough is nonproductive.  She reports drainage coming from her nose down her throat causing her to cough.  She denies any purulent sputum.  She denies any shortness of breath.  She denies any chest pain.  She denies any fevers or chills.  She is been taking allergy medication without relief.  She also reports pain and pressure in her frontal sinuses.  She reports a dull constant headache between her eyes.  She reports postnasal drip and head congestion and rhinorrhea.  She denies any nausea vomiting diarrhea or abdominal pain Past Medical History:  Diagnosis Date   Dyslipidemia    Palpitations    Past Surgical History:  Procedure Laterality Date   FRACTURE SURGERY  08/2013   HAND   ORIF WRIST FRACTURE Right 08/24/2013   Procedure: OPEN REDUCTION INTERNAL FIXATION (ORIF) RIGHT WRIST FRACTURE;  Surgeon: Linna Hoff, MD;  Location: [facility];  Service: Orthopedics;  Laterality: Right;   TUBAL LIGATION     Current Outpatient Medications on File Prior to Visit  Medication Sig Dispense Refill   Multiple Vitamin (MULTIVITAMIN WITH MINERALS) TABS tablet Take 1 tablet by mouth daily.     loratadine (CLARITIN) 10 MG tablet Take 10 mg by mouth as needed.  (Patient not taking: Reported on 01/14/2021)     meloxicam (MOBIC) 7.5 MG tablet TAKE 1 TABLET(7.5 MG) BY MOUTH DAILY AS NEEDED FOR PAIN (Patient not taking: No sig reported) 30 tablet 1   neomycin-polymyxin b-dexamethasone (MAXITROL) 3.5-10000-0.1 SUSP Place 1 drop into the right eye 3 (three) times daily as needed. (Patient not taking: Reported on 01/14/2021)     No current facility-administered medications on file prior to visit.   Marland Kitchenall Social History   Socioeconomic History   Marital status: Single    Spouse name: Not on file   Number of children: Not on file   Years of education: Not on file    Highest education level: Not on file  Occupational History   Not on file  Tobacco Use   Smoking status: Some Days    Packs/day: 0.00    Pack years: 0.00    Types: Cigarettes   Smokeless tobacco: Never   Tobacco comments:    maybe 1-2 per day  Vaping Use   Vaping Use: Never used  Substance and Sexual Activity   Alcohol use: No   Drug use: No   Sexual activity: Not Currently  Other Topics Concern   Not on file  Social History Narrative   Lives daughter.     Social Determinants of Health   Financial Resource Strain: Not on file  Food Insecurity: Not on file  Transportation Needs: Not on file  Physical Activity: Not on file  Stress: Not on file  Social Connections: Not on file  Intimate Partner Violence: Not on file     Review of Systems  All other systems reviewed and are negative.     Objective:   Physical Exam Vitals reviewed.  Constitutional:      Appearance: Normal appearance. She is normal weight.  HENT:     Head: Normocephalic and atraumatic.     Right Ear: Tympanic membrane and ear canal normal.     Left Ear: Tympanic membrane and ear canal normal.     Nose: Congestion and rhinorrhea present.  Right Turbinates: Enlarged and swollen.     Left Turbinates: Enlarged and swollen.     Right Sinus: Frontal sinus tenderness present.     Left Sinus: Frontal sinus tenderness present.  Cardiovascular:     Rate and Rhythm: Normal rate and regular rhythm.     Heart sounds: Normal heart sounds.  Pulmonary:     Effort: Pulmonary effort is normal. No respiratory distress.     Breath sounds: Normal breath sounds. No wheezing, rhonchi or rales.  Musculoskeletal:     Cervical back: Normal range of motion.  Neurological:     Mental Status: She is alert.          Assessment & Plan:  Rhinosinusitis - Plan: SARS-COV-2 RNA,(COVID-19) QUAL NAAT Patient appears to have a sinus infection.  Begin amoxicillin 875 mg twice daily for 10 days.  Use Tessalon Perles 200 mg  every 8 hours as needed for cough.  Screen the patient for COVID

## 2021-01-16 LAB — SARS-COV-2 RNA,(COVID-19) QUALITATIVE NAAT: SARS CoV2 RNA: NOT DETECTED

## 2021-01-17 ENCOUNTER — Telehealth: Payer: Self-pay | Admitting: Nurse Practitioner

## 2021-01-17 DIAGNOSIS — J329 Chronic sinusitis, unspecified: Secondary | ICD-10-CM

## 2021-01-17 MED ORDER — HYDROCOD POLST-CPM POLST ER 10-8 MG/5ML PO SUER: 5.0000 mL | Freq: Two times a day (BID) | ORAL | 0 refills | Status: DC | PRN

## 2021-01-17 NOTE — Telephone Encounter (Signed)
Received  call from patient's daughter [name] to request different cough medicine for patient; medication prescribed isn't working.  Pharmacy confirmed as  [facility] [phone] - [name], [name] [address]  [address] [name] [address]  Phone:  [phone]  Fax:  [phone]  DEA #:  [id number]   Please advise Allison Baker at [phone].

## 2021-04-14 ENCOUNTER — Encounter: Payer: Self-pay | Admitting: Nurse Practitioner

## 2021-04-14 ENCOUNTER — Ambulatory Visit: Payer: 59 | Admitting: Nurse Practitioner

## 2021-04-14 ENCOUNTER — Other Ambulatory Visit: Payer: Self-pay

## 2021-04-14 VITALS — BP 104/68 | HR 64 | Ht 62.0 in | Wt 138.0 lb

## 2021-04-14 DIAGNOSIS — Z1231 Encounter for screening mammogram for malignant neoplasm of breast: Secondary | ICD-10-CM | POA: Diagnosis not present

## 2021-04-14 DIAGNOSIS — Z13820 Encounter for screening for osteoporosis: Secondary | ICD-10-CM | POA: Diagnosis not present

## 2021-04-14 DIAGNOSIS — L918 Other hypertrophic disorders of the skin: Secondary | ICD-10-CM | POA: Diagnosis not present

## 2021-04-14 DIAGNOSIS — J339 Nasal polyp, unspecified: Secondary | ICD-10-CM

## 2021-04-14 DIAGNOSIS — Z23 Encounter for immunization: Secondary | ICD-10-CM | POA: Diagnosis not present

## 2021-04-14 MED ORDER — FLUTICASONE PROPIONATE 50 MCG/ACT NA SUSP: 2.0000 | Freq: Every day | NASAL | 6 refills | Status: DC

## 2021-04-14 NOTE — Progress Notes (Signed)
Subjective:    Patient ID: Allison Baker, female    DOB: 1956-01-06, 65 y.o.   MRN: [id number]  HPI: Allison Baker is a 65 y.o. female presenting for skin lesion follow up.  Chief Complaint  Patient presents with   bump on back head     Getting bigger x2 years and in noses icthing, no drainage    SKIN LESION Duration: years Location: back of scalp, also one inside nose  History of trauma in area: no Pain: yes, only when she brushes her hair and it snags Quality sharp Severity: moderate to severe Redness: no Swelling: no Oozing: no Pus: no Fevers: no Nausea/vomiting: no Status: stable Treatments attempted: monitoring  Reports bump in nose has been there for months.  It does itch at times.  She has not noticed any drainage, redness, runny nose, congestion.  No Known Allergies  Outpatient Encounter Medications as of 04/14/2021  Medication Sig   fluticasone (FLONASE) 50 MCG/ACT nasal spray Place 2 sprays into both nostrils daily.   loratadine (CLARITIN) 10 MG tablet Take 10 mg by mouth as needed.   Multiple Vitamin (MULTIVITAMIN WITH MINERALS) TABS tablet Take 1 tablet by mouth daily.   neomycin-polymyxin b-dexamethasone (MAXITROL) 3.5-10000-0.1 SUSP Place 1 drop into the right eye 3 (three) times daily as needed.   [DISCONTINUED] benzonatate (TESSALON) 200 MG capsule Take 1 capsule (200 mg total) by mouth 3 (three) times daily as needed for cough.   [DISCONTINUED] chlorpheniramine-HYDROcodone (TUSSIONEX PENNKINETIC ER) 10-8 MG/5ML SUER Take 5 mLs by mouth every 12 (twelve) hours as needed for cough.   [DISCONTINUED] meloxicam (MOBIC) 7.5 MG tablet TAKE 1 TABLET(7.5 MG) BY MOUTH DAILY AS NEEDED FOR PAIN (Patient not taking: No sig reported)   No facility-administered encounter medications on file as of 04/14/2021.    Patient Active Problem List   Diagnosis Date Noted   Palpitation 01/25/2018   Mild hyperlipidemia 11/10/2017    Past Medical History:  Diagnosis Date    Dyslipidemia    Palpitations     Relevant past medical, surgical, family and social history reviewed and updated as indicated. Interim medical history since our last visit reviewed.  Review of Systems Per HPI unless specifically indicated above     Objective:    BP 104/68   Pulse 64   Ht '5\' 2"'$  (1.575 m)   Wt 138 lb (62.6 kg)   SpO2 96%   BMI 25.24 kg/m   Wt Readings from Last 3 Encounters:  04/14/21 138 lb (62.6 kg)  01/14/21 140 lb (63.5 kg)  09/02/20 136 lb (61.7 kg)    Physical Exam Vitals and nursing note reviewed.  Constitutional:      General: She is not in acute distress.    Appearance: Normal appearance. She is not toxic-appearing.  HENT:     Head: Normocephalic and atraumatic.     Salivary Glands: Right salivary gland is not diffusely enlarged or tender. Left salivary gland is not diffusely enlarged or tender.      Comments: Flesh colored acrochordon noted to posterior scalp.  No erythema, oozing, drainage, bleeding.    Right Ear: Tympanic membrane, ear canal and external ear normal.     Left Ear: Tympanic membrane, ear canal and external ear normal.     Nose: No congestion or rhinorrhea.     Comments: Nasal polyp right nare    Mouth/Throat:     Mouth: Mucous membranes are moist.     Pharynx: Oropharynx is clear. No oropharyngeal exudate  or posterior oropharyngeal erythema.  Eyes:     General: No scleral icterus.       Right eye: No discharge.        Left eye: No discharge.     Extraocular Movements: Extraocular movements intact.  Skin:    General: Skin is warm and dry.     Coloration: Skin is not jaundiced or pale.     Findings: No erythema.  Neurological:     Mental Status: She is alert and oriented to person, place, and time.     Motor: No weakness.     Gait: Gait normal.  Psychiatric:        Mood and Affect: Mood normal.        Behavior: Behavior normal.        Thought Content: Thought content normal.        Judgment: Judgment normal.       Assessment & Plan:  1. Skin tag Chronic.  We have been monitoring this for months to years, however patient reports this is increasingly bothersome, especially when she combs or brushes her hair.  Will place referral to dermatology for removal since this is on her scalp.  - Ambulatory referral to Dermatology  2. Nasal polyp Polyp noted to right nare.  Discussed use of Flonase to help with size and polyp, if this does not help we can consider referral to ENT.  - fluticasone (FLONASE) 50 MCG/ACT nasal spray; Place 2 sprays into both nostrils daily.  Dispense: 16 g; Refill: 6  3. Encounter for screening mammogram for malignant neoplasm of breast  - MM 3D SCREEN BREAST BILATERAL; Future  4. Screening for osteoporosis  - DG Bone Density; Future  5. Need for pneumococcal vaccination  - Pneumococcal conjugate vaccine 13-valent IM    Follow up plan: Return for wellness.

## 2021-04-14 NOTE — Patient Instructions (Addendum)
Mammogram and Bone Density number: [phone]  Dermatology office number: [phone]

## 2021-04-27 NOTE — Patient Instructions (Addendum)
[facility] Link Snuffer  [phone] ph or [phone] [address], [place] [address], [place] [address]  [facility] [phone] [place], [place], [place] [address]   Cuidados preventivos en las mujeres a partir de los 7 [place] 65 Years and Older, Female Los cuidados preventivos hacen referencia a las opciones en cuanto al estilo de vida y a las visitas al mdico, las cuales pueden promover la salud y Musician. Esto puede comprender lo siguiente: Un examen fsico anual. Esto tambin se conoce como visita de control de bienestar anual. Exmenes dentales y oculares de [place] regular. Vacunas. Estudios para Engineer, building services. Elecciones para un estilo de vida saludable, por ejemplo: Seguir una dieta saludable. Practicar actividad fsica con regularidad. No consumir drogas ni productos que contengan nicotina y tabaco. Limitar el consumo de bebidas alcohlicas. Qu puedo esperar para mi visita de cuidado preventivo? Examen fsico El mdico revisar lo siguiente: IT consultant y [place]. Estos pueden usarse para calcular el IMC (ndice de masa corporal). El [facility] es una medicin que indica si tiene un peso saludable. Frecuencia cardaca y presin arterial. Temperatura corporal. Piel para detectar manchas anormales. Asesoramiento Su mdico puede preguntarle acerca de: Problemas mdicos pasados. Antecedentes mdicos familiares. Consumo de tabaco, alcohol y drogas. Su bienestar emocional. Restaurant manager, fast food y las relaciones personales. Su actividad sexual. Hbitos de alimentacin, ejercicio y sueo. Sus antecedentes de cadas. Memoria y capacidad de comprensin (facultades cognitivas). Su trabajo y [place] y antecedentes menstruales. Acceso a armas de fuego. Qu vacunas necesito? Las vacunas se aplican a varias edades, segn un calendario. El [employer]  recomendar vacunas segn su edad, sus antecedentes mdicos, su estilo de vida y otros factores, como los viajes o el lugar donde trabaja. Qu pruebas necesito? Anlisis de [place] de lpidos y colesterol. Estos pueden controlarse cada 5 aos o ms a menudo segn su salud general. Anlisis de hepatitis C. Anlisis de hepatitis B. Pruebas de deteccin Pruebas de deteccin de cncer de pulmn. Es posible que se le realice esta prueba de deteccin a partir de los 47 aos de edad, si ha fumado durante 30 aos un paquete diario y sigue fumando o dej el hbito en algn momento en los ltimos 15 aos. Pruebas de Programme researcher, broadcasting/film/video de Surveyor, minerals. Todos los adultos a partir de los 62 aos de edad y [place] 26 aos de edad deben hacerse esta prueba de deteccin. El mdico puede recomendarle las pruebas de deteccin a partir de los 84 aos de edad si corre un mayor riesgo. Le realizarn pruebas cada 1 a 10 aos, segn los [place] y el tipo de prueba de Programme researcher, broadcasting/film/video. Pruebas de deteccin de la diabetes. Esto se Set designer un control del azcar en la sangre (glucosa) despus de no haber comido durante un periodo de tiempo (ayuno). Es posible que se le realice esta prueba cada 1 a 3 aos. Mamografa. Se puede realizar cada 1 o 2 aos. Hable con el mdico sobre la frecuencia con la que debe realizarse [place]. Estudio de deteccin de aneurisma artico abdominal (AAA). Es posible que necesite este estudio si es fumador o lo fue en el pasado. Pruebas de deteccin de cncer relacionado con las mutaciones del BRCA. Es posible que se las deba realizar si tiene antecedentes de cncer de mama, de ovario, de trompas o peritoneal. Otras pruebas Pruebas de enfermedades de transmisin sexual (ETS), si est en riesgo.  Densitometra sea. Esto se realiza para detectar osteoporosis. Es posible que se le realice esta prueba a partir de los 18 aos de [place]. Hable con su mdico [employer], las opciones de tratamiento y, si corresponde, la necesidad de Optometrist ms pruebas. Siga estas instrucciones en su casa: Comida y bebida  Siga una dieta que incluya frutas y verduras frescas, cereales integrales, protenas magras y productos lcteos descremados. Limite el consumo de alimentos con alto contenido de Location manager, grasas saturadas y sal. Tome los suplementos vitamnicos y minerales como se lo haya indicado el mdico. No beba alcohol si el mdico se lo prohbe. Si bebe alcohol: Limite la cantidad que consume de 0 a 1 medida por da. Est atenta a la cantidad de alcohol que hay en las bebidas que toma. En los [place], una medida equivale a una botella de cerveza de 12 oz (355 ml), un vaso de vino de 5 oz (148 ml) o un vaso de una bebida alcohlica de alta graduacin de 1 oz (44 ml). Estilo de [employer] y las encas a diario. Cepllese los dientes a la maana y a la noche con pasta dental con fluoruro. Use hilo dental una vez al da. Mantngase activa. Haga al menos 30 minutos de ejercicio, 5 o ms das cada semana. No consuma ningn producto que contenga nicotina o tabaco, como cigarrillos, cigarrillos electrnicos y tabaco de Higher education careers adviser. Si necesita ayuda para dejar de fumar, consulte al mdico. No consuma drogas. Si es sexualmente activa, practique sexo seguro. Use un condn u otra forma de proteccin para prevenir las ITS (infecciones de transmisin sexual). Hable con el mdico acerca de tomar una dosis baja de aspirina o estatina. Encuentre formas saludables de lidiar con el estrs tales como: Meditacin, yoga o Conservation officer, nature. Lleve un diario personal. Hable con una persona confiable. Pase tiempo con amigos y familiares. Seguridad [place] siempre el cinturn de seguridad al conducir o viajar en un vehculo. No conduzca: Si ha estado bebiendo alcohol. No viaje con un conductor que ha estado bebiendo. Si est cansada o distrada. Mientras est enviando  mensajes de texto. Use un casco y otros equipos de proteccin [place] deportivas. Si tiene armas de fuego en su casa, asegrese de seguir todos los procedimientos de seguridad correspondientes. Cundo volver? Visite al mdico una vez al ao para una visita anual de control de bienestar. Pregntele al mdico con qu frecuencia debe realizarse un control de la vista y los dientes. Mantenga su esquema de vacunacin al da. Esta informacin no tiene Marine scientist el consejo del mdico. Asegrese de hacerle al mdico cualquier pregunta que tenga. Document Revised: 07/10/2020 Document Reviewed: 07/05/2019 Elsevier Patient Education  2022 [employer].

## 2021-04-27 NOTE — Progress Notes (Signed)
BP 110/62   Pulse (!) 58   Temp 97.7 F (36.5 C) (Temporal)   Resp 16   Ht 5\' 2"  (1.575 m)   Wt 137 lb (62.1 kg)   SpO2 97%   BMI 25.06 kg/m    Subjective:    Patient ID: Allison Baker, female    DOB: May 13, 1956, 65 y.o.   MRN: [id number]  HPI: Allison Baker is a 65 y.o. female presenting on 04/28/2021 for comprehensive medical examination. Current medical complaints include: none  She currently lives with: daughter, 2 granddaughters, husband and step grandchildren  Postmenopausal: no symptoms  Depression Screen done today and results listed below:  Depression screen [facility] 2/9 04/29/2021 01/14/2021 04/04/2019 11/09/2017 10/20/2017  Decreased Interest 0 0 0 0 0  Down, Depressed, Hopeless 0 0 0 0 0  PHQ - 2 Score 0 0 0 0 0  Altered sleeping - - 0 - -  Tired, decreased energy - - 0 - -  Change in appetite - - 0 - -  Feeling bad or failure about yourself  - - 0 - -  Trouble concentrating - - 0 - -  Moving slowly or fidgety/restless - - 0 - -  Suicidal thoughts - - 0 - -  PHQ-9 Score - - 0 - -  Difficult doing work/chores - - Not difficult at all - -    The patient does not have a history of falls. I did not complete a risk assessment for falls. A plan of care for falls was not documented.   Past Medical History:  Past Medical History:  Diagnosis Date   Dyslipidemia    Palpitations     Surgical History:  Past Surgical History:  Procedure Laterality Date   FRACTURE SURGERY  08/2013   HAND   ORIF WRIST FRACTURE Right 08/24/2013   Procedure: OPEN REDUCTION INTERNAL FIXATION (ORIF) RIGHT WRIST FRACTURE;  Surgeon: Linna Hoff, MD;  Location: [facility];  Service: Orthopedics;  Laterality: Right;   TUBAL LIGATION      Medications:  Current Outpatient Medications on File Prior to Visit  Medication Sig   fluticasone (FLONASE) 50 MCG/ACT nasal spray Place 2 sprays into both nostrils daily.   loratadine (CLARITIN) 10 MG tablet Take 10 mg by mouth as needed.   Multiple Vitamin  (MULTIVITAMIN WITH MINERALS) TABS tablet Take 1 tablet by mouth daily.   neomycin-polymyxin b-dexamethasone (MAXITROL) 3.5-10000-0.1 SUSP Place 1 drop into the right eye 3 (three) times daily as needed.   No current facility-administered medications on file prior to visit.    Allergies:  No Known Allergies  Social History:  Social History   Socioeconomic History   Marital status: Single    Spouse name: Not on file   Number of children: Not on file   Years of education: Not on file   Highest education level: Not on file  Occupational History   Not on file  Tobacco Use   Smoking status: Some Days    Packs/day: 0.00    Types: Cigarettes   Smokeless tobacco: Never   Tobacco comments:    maybe 1-2 per day  Vaping Use   Vaping Use: Never used  Substance and Sexual Activity   Alcohol use: No   Drug use: No   Sexual activity: Not Currently  Other Topics Concern   Not on file  Social History Narrative   Lives daughter.     Social Determinants of Health   Financial Resource Strain: Not on file  Food Insecurity: Not on file  Transportation Needs: Not on file  Physical Activity: Not on file  Stress: Not on file  Social Connections: Not on file  Intimate Partner Violence: Not on file   Social History   Tobacco Use  Smoking Status Some Days   Packs/day: 0.00   Types: Cigarettes  Smokeless Tobacco Never  Tobacco Comments   maybe 1-2 per day   Social History   Substance and Sexual Activity  Alcohol Use No    Family History:  Family History  Problem Relation Age of Onset   Cancer Father        leukemia   Cancer Sister        ovarian   Cancer Brother        brain   Colon cancer Neg Hx    Esophageal cancer Neg Hx    Rectal cancer Neg Hx    Stomach cancer Neg Hx     Past medical history, surgical history, medications, allergies, family history and social history reviewed with patient today and changes made to appropriate areas of the chart.   Review of  Systems  Constitutional: Negative.   HENT: Negative.    Eyes: Negative.   Respiratory: Negative.    Cardiovascular: Negative.   Gastrointestinal: Negative.   Genitourinary: Negative.   Musculoskeletal: Negative.   Skin: Negative.   Neurological: Negative.   Psychiatric/Behavioral: Negative.        Objective:    BP 110/62   Pulse (!) 58   Temp 97.7 F (36.5 C) (Temporal)   Resp 16   Ht 5\' 2"  (1.575 m)   Wt 137 lb (62.1 kg)   SpO2 97%   BMI 25.06 kg/m   Wt Readings from Last 3 Encounters:  04/28/21 137 lb (62.1 kg)  04/14/21 138 lb (62.6 kg)  01/14/21 140 lb (63.5 kg)    Physical Exam Vitals and nursing note reviewed. Exam conducted with a chaperone present (CW).  Constitutional:      General: She is not in acute distress.    Appearance: Normal appearance. She is not toxic-appearing.  HENT:     Head: Normocephalic and atraumatic.     Right Ear: Tympanic membrane, ear canal and external ear normal.     Left Ear: Tympanic membrane, ear canal and external ear normal.     Nose: Nose normal. No congestion or rhinorrhea.     Mouth/Throat:     Mouth: Mucous membranes are moist.     Pharynx: Oropharynx is clear. No oropharyngeal exudate.  Eyes:     General: No scleral icterus.    Extraocular Movements: Extraocular movements intact.     Pupils: Pupils are equal, round, and reactive to light.  Cardiovascular:     Rate and Rhythm: Normal rate and regular rhythm.     Pulses: Normal pulses.     Heart sounds: Normal heart sounds. No murmur heard. Pulmonary:     Effort: Pulmonary effort is normal. No respiratory distress.     Breath sounds: No wheezing or rhonchi.  Chest:  Breasts:    Right: Normal. No inverted nipple, mass, nipple discharge or skin change.     Left: Normal. No inverted nipple, mass, nipple discharge or skin change.  Abdominal:     General: Abdomen is flat. Bowel sounds are normal. There is no distension.     Palpations: Abdomen is soft.     Tenderness:  There is no abdominal tenderness.  Genitourinary:    Comments: GU exam deferred  per patient request Musculoskeletal:        General: No swelling or tenderness. Normal range of motion.     Cervical back: Normal range of motion and neck supple. No rigidity or tenderness.     Right lower leg: No edema.     Left lower leg: No edema.  Skin:    General: Skin is warm and dry.     Capillary Refill: Capillary refill takes less than 2 seconds.     Coloration: Skin is not jaundiced or pale.  Neurological:     General: No focal deficit present.     Mental Status: She is alert and oriented to person, place, and time.     Motor: No weakness.     Gait: Gait normal.  Psychiatric:        Mood and Affect: Mood normal.        Behavior: Behavior normal.        Thought Content: Thought content normal.        Judgment: Judgment normal.      Assessment & Plan:   Problem List Items Addressed This Visit       Other   Vitamin D deficiency   Relevant Orders   VITAMIN D 25 Hydroxy (Vit-D Deficiency, Fractures)   Mild hyperlipidemia   Relevant Orders   Lipid panel (Completed)   Other Visit Diagnoses     Annual physical exam    -  Primary   Screening for iron deficiency anemia       Relevant Orders   CBC with Differential/Platelet (Completed)   Screening for metabolic disorder       Relevant Orders   COMPLETE METABOLIC PANEL WITH GFR (Completed)   Need for immunization against influenza       Relevant Orders   Flu Vaccine QUAD High Dose(Fluad) (Completed)        Follow up plan: Return for pending lab work.   LABORATORY TESTING:  - Pap smear: patient wants to reschedule  IMMUNIZATIONS:   - Tdap: Tetanus vaccination status reviewed: last tetanus booster within 10 years. - Influenza: Administered today - Pneumovax: Not applicable - Prevnar: Not applicable - HPV: Not applicable - Zostavax vaccine: she is going to call insurance to inquire about cost - COVID-19 vaccine: has had 1&2  doses; interested in new booster and I did recommend this   SCREENING: -Mammogram: Ordered today  - Colonoscopy: Up to date  - Bone Density: Ordered today  -Hearing Test: Not applicable  -Spirometry: Not applicable   PATIENT COUNSELING:    Sexuality: Discussed sexually transmitted diseases, partner selection, use of condoms, avoidance of unintended pregnancy  and contraceptive alternatives.   Advised to avoid cigarette smoking.  I discussed with the patient that most people either abstain from alcohol or drink within safe limits (<=14/week and <=4 drinks/occasion for males, <=7/weeks and <= 3 drinks/occasion for females) and that the risk for alcohol disorders and other health effects rises proportionally with the number of drinks per week and how often a drinker exceeds daily limits.  Discussed cessation/primary prevention of drug use and availability of treatment for abuse.   Diet: Encouraged to adjust caloric intake to maintain  or achieve ideal body weight, to reduce intake of dietary saturated fat and total fat, to limit sodium intake by avoiding high sodium foods and not adding table salt, and to maintain adequate dietary potassium and calcium preferably from fresh fruits, vegetables, and low-fat dairy products.    stressed the importance of regular  exercise  Injury prevention: Discussed safety belts, safety helmets, smoke detector, smoking near bedding or upholstery.   Dental health: Discussed importance of regular tooth brushing, flossing, and dental visits.    NEXT PREVENTATIVE PHYSICAL DUE IN 1 YEAR. Return for pending lab work.

## 2021-04-28 ENCOUNTER — Other Ambulatory Visit: Payer: Self-pay

## 2021-04-28 ENCOUNTER — Encounter: Payer: Self-pay | Admitting: Nurse Practitioner

## 2021-04-28 ENCOUNTER — Ambulatory Visit (INDEPENDENT_AMBULATORY_CARE_PROVIDER_SITE_OTHER): Payer: 59 | Admitting: Nurse Practitioner

## 2021-04-28 VITALS — BP 110/62 | HR 58 | Temp 97.7°F | Resp 16 | Ht 62.0 in | Wt 137.0 lb

## 2021-04-28 DIAGNOSIS — Z23 Encounter for immunization: Secondary | ICD-10-CM | POA: Diagnosis not present

## 2021-04-28 DIAGNOSIS — Z130 Encounter for screening for diseases of the blood and blood-forming organs and certain disorders involving the immune mechanism: Secondary | ICD-10-CM

## 2021-04-28 DIAGNOSIS — Z0000 Encounter for general adult medical examination without abnormal findings: Secondary | ICD-10-CM

## 2021-04-28 DIAGNOSIS — E785 Hyperlipidemia, unspecified: Secondary | ICD-10-CM | POA: Diagnosis not present

## 2021-04-28 DIAGNOSIS — E559 Vitamin D deficiency, unspecified: Secondary | ICD-10-CM | POA: Insufficient documentation

## 2021-04-28 DIAGNOSIS — Z13228 Encounter for screening for other metabolic disorders: Secondary | ICD-10-CM

## 2021-04-29 ENCOUNTER — Other Ambulatory Visit: Payer: Self-pay | Admitting: *Deleted

## 2021-04-29 LAB — CBC WITH DIFFERENTIAL/PLATELET
Absolute Monocytes: 700 cells/uL (ref 200–950)
Basophils Absolute: 59 cells/uL (ref 0–200)
Basophils Relative: 0.9 %
Eosinophils Absolute: 211 cells/uL (ref 15–500)
Eosinophils Relative: 3.2 %
HCT: 42.6 % (ref 35.0–45.0)
Hemoglobin: 14.1 g/dL (ref 11.7–15.5)
Lymphs Abs: 3056 cells/uL (ref 850–3900)
MCH: 30 pg (ref 27.0–33.0)
MCHC: 33.1 g/dL (ref 32.0–36.0)
MCV: 90.6 fL (ref 80.0–100.0)
MPV: 10.6 fL (ref 7.5–12.5)
Monocytes Relative: 10.6 %
Neutro Abs: 2574 cells/uL (ref 1500–7800)
Neutrophils Relative %: 39 %
Platelets: 307 10*3/uL (ref 140–400)
RBC: 4.7 10*6/uL (ref 3.80–5.10)
RDW: 12.8 % (ref 11.0–15.0)
Total Lymphocyte: 46.3 %
WBC: 6.6 10*3/uL (ref 3.8–10.8)

## 2021-04-29 LAB — COMPLETE METABOLIC PANEL WITH GFR
AG Ratio: 1.5 (calc) (ref 1.0–2.5)
ALT: 13 U/L (ref 6–29)
AST: 20 U/L (ref 10–35)
Albumin: 4.4 g/dL (ref 3.6–5.1)
Alkaline phosphatase (APISO): 95 U/L (ref 37–153)
BUN: 12 mg/dL (ref 7–25)
CO2: 29 mmol/L (ref 20–32)
Calcium: 9.9 mg/dL (ref 8.6–10.4)
Chloride: 103 mmol/L (ref 98–110)
Creat: 0.77 mg/dL (ref 0.50–1.05)
Globulin: 2.9 g/dL (calc) (ref 1.9–3.7)
Glucose, Bld: 78 mg/dL (ref 65–99)
Potassium: 4.7 mmol/L (ref 3.5–5.3)
Sodium: 139 mmol/L (ref 135–146)
Total Bilirubin: 0.6 mg/dL (ref 0.2–1.2)
Total Protein: 7.3 g/dL (ref 6.1–8.1)
eGFR: 85 mL/min/{1.73_m2} (ref >=60)

## 2021-04-29 LAB — LIPID PANEL
Cholesterol: 228 mg/dL — ABNORMAL HIGH (ref <200)
HDL: 55 mg/dL (ref >=50)
LDL Cholesterol (Calc): 153 mg/dL (calc) — ABNORMAL HIGH
Non-HDL Cholesterol (Calc): 173 mg/dL (calc) — ABNORMAL HIGH (ref <130)
Total CHOL/HDL Ratio: 4.1 (calc) (ref <5.0)
Triglycerides: 92 mg/dL (ref <150)

## 2021-04-29 LAB — VITAMIN D 25 HYDROXY (VIT D DEFICIENCY, FRACTURES): Vit D, 25-Hydroxy: 28 ng/mL — ABNORMAL LOW (ref 30–100)

## 2021-04-29 MED ORDER — ATORVASTATIN CALCIUM 10 MG PO TABS: 10.0000 mg | ORAL_TABLET | Freq: Every day | ORAL | 3 refills | Status: DC

## 2021-07-31 ENCOUNTER — Ambulatory Visit
Admission: RE | Admit: 2021-07-31 | Discharge: 2021-07-31 | Disposition: A | Discharge status: Home / Self Care | Payer: 59 | Source: Ambulatory Visit | Attending: Nurse Practitioner | Admitting: Nurse Practitioner

## 2021-07-31 ENCOUNTER — Encounter: Payer: Self-pay | Admitting: Nurse Practitioner

## 2021-07-31 ENCOUNTER — Other Ambulatory Visit: Payer: Self-pay

## 2021-07-31 ENCOUNTER — Telehealth: Payer: Self-pay | Admitting: Nurse Practitioner

## 2021-07-31 ENCOUNTER — Telehealth (INDEPENDENT_AMBULATORY_CARE_PROVIDER_SITE_OTHER): Payer: 59 | Admitting: Nurse Practitioner

## 2021-07-31 ENCOUNTER — Telehealth: Payer: Self-pay

## 2021-07-31 DIAGNOSIS — R051 Acute cough: Secondary | ICD-10-CM | POA: Diagnosis not present

## 2021-07-31 DIAGNOSIS — U071 COVID-19: Secondary | ICD-10-CM

## 2021-07-31 DIAGNOSIS — R091 Pleurisy: Secondary | ICD-10-CM | POA: Diagnosis not present

## 2021-07-31 DIAGNOSIS — J069 Acute upper respiratory infection, unspecified: Secondary | ICD-10-CM

## 2021-07-31 LAB — INFLUENZA A AND B AG, IMMUNOASSAY
INFLUENZA A ANTIGEN: NOT DETECTED
INFLUENZA B ANTIGEN: NOT DETECTED

## 2021-07-31 MED ORDER — PROMETHAZINE-DM 6.25-15 MG/5ML PO SYRP: 5.0000 mL | ORAL_SOLUTION | Freq: Two times a day (BID) | ORAL | 0 refills | Status: DC | PRN

## 2021-07-31 MED ORDER — NIRMATRELVIR/RITONAVIR (PAXLOVID)TABLET: 3.0000 | ORAL_TABLET | Freq: Two times a day (BID) | ORAL | 0 refills | Status: AC

## 2021-07-31 NOTE — Telephone Encounter (Signed)
Patient aware of recommendations.  Letter printed, patient daughter aware and will pick up.

## 2021-07-31 NOTE — Telephone Encounter (Signed)
Received call from patient's daughter [name] to report two positive COVID tests taken today; seeking advice.\ Sx: chills, fever, body aches, cough, headache.   Please advise at [phone].

## 2021-07-31 NOTE — Progress Notes (Signed)
Subjective:    Patient ID: Allison Baker, female    DOB: 17-Dec-1955, 65 y.o.   MRN: [id number]  HPI: Allison Baker is a 65 y.o. female presenting virtually with daughter for body aches, congestion, and fever.  Chief Complaint  Patient presents with   Cough   UPPER RESPIRATORY TRACT INFECTION Onset: Tuesday night night  COVID-19 testing history: has not done Fever: yes; 100.1 Body aches: yes Cough: yes; dry Shortness of breath: no Wheezing: no Chest pain: yes, with cough and with breathing Chest tightness: yes Chest congestion: no Nasal congestion: yes Runny nose: yes Post nasal drip: yes Sneezing: yes Sore throat: yes with coughing Swollen glands: no Sinus pressure: yes - eyes Headache: yes Face pain: no Toothache: no Ear pain: no  Ear pressure: no  Eyes red/itching:no Eye drainage/crusting: no  Nausea: no  Vomiting: no Diarrhea: no  Change in appetite: yes ; decreased Loss of taste/smell: no  Rash: no Fatigue: yes Sick contacts: no Strep contacts: no  Context: stable Recurrent sinusitis: no Treatments attempted: Tylenol, Vicks Relief with OTC medications: some  No Known Allergies  Outpatient Encounter Medications as of 07/31/2021  Medication Sig   promethazine-dextromethorphan (PROMETHAZINE-DM) 6.25-15 MG/5ML syrup Take 5 mLs by mouth every 12 (twelve) hours as needed for cough. Do not take while driving or operating heavy machinery   atorvastatin (LIPITOR) 10 MG tablet Take 1 tablet (10 mg total) by mouth daily.   fluticasone (FLONASE) 50 MCG/ACT nasal spray Place 2 sprays into both nostrils daily.   loratadine (CLARITIN) 10 MG tablet Take 10 mg by mouth as needed.   Multiple Vitamin (MULTIVITAMIN WITH MINERALS) TABS tablet Take 1 tablet by mouth daily.   neomycin-polymyxin b-dexamethasone (MAXITROL) 3.5-10000-0.1 SUSP Place 1 drop into the right eye 3 (three) times daily as needed.   No facility-administered encounter medications on file as of 07/31/2021.     Patient Active Problem List   Diagnosis Date Noted   Vitamin D deficiency 04/28/2021   Palpitation 01/25/2018   Mild hyperlipidemia 11/10/2017    Past Medical History:  Diagnosis Date   Dyslipidemia    Palpitations     Relevant past medical, surgical, family and social history reviewed and updated as indicated. Interim medical history since our last visit reviewed.  Review of Systems Per HPI unless specifically indicated above     Objective:    There were no vitals taken for this visit.  Wt Readings from Last 3 Encounters:  04/28/21 137 lb (62.1 kg)  04/14/21 138 lb (62.6 kg)  01/14/21 140 lb (63.5 kg)    Physical Exam Vitals and nursing note reviewed.  Constitutional:      General: She is not in acute distress.    Appearance: Normal appearance. She is not toxic-appearing.  HENT:     Right Ear: External ear normal.     Left Ear: External ear normal.     Nose: Congestion present.     Mouth/Throat:     Mouth: Mucous membranes are moist.     Pharynx: Oropharynx is clear.  Eyes:     General: No scleral icterus.       Right eye: No discharge.        Left eye: No discharge.  Cardiovascular:     Comments: Unable to assess heart sounds via virtual visit. Pulmonary:     Effort: Pulmonary effort is normal. No respiratory distress.     Comments: Unable to assess lung sounds via virtual visit.  No accessory muscle  use.  Occasional dry sounding cough. Skin:    Coloration: Skin is not jaundiced or pale.     Findings: No erythema.  Neurological:     Mental Status: She is alert and oriented to person, place, and time.  Psychiatric:        Mood and Affect: Mood normal.        Behavior: Behavior normal.        Thought Content: Thought content normal.        Judgment: Judgment normal.      Assessment & Plan:  1. Viral upper respiratory tract infection Acute.  Suspect viral etiology.  Will check rapid flu test.  Also offered viral testing including COVID, RSV, etc.,   However patient and daughter declined politely.  Reassured patient that symptoms and exam findings are most consistent with a viral upper respiratory infection and explained lack of efficacy of antibiotics against viruses.  Discussed expected course and features suggestive of secondary bacterial infection.  Continue supportive care. Increase fluid intake with water or electrolyte solution like pedialyte. Encouraged acetaminophen as needed for fever/pain. Encouraged salt water gargling, chloraseptic spray and throat lozenges. Encouraged OTC guaifenesin. Encouraged saline sinus flushes and/or neti with humidified air.  Some concern with sudden onset of pain with inspiration, history of smoking, reports of pain in lungs.  Will check chest x-ray to rule out acute bacterial pneumonia.  Start cough suppressant.  Follow-up if symptoms persist more than 7 to 10 days.  If symptoms worsen, go to urgent care.  - Influenza A and B Ag, Immunoassay  2. Pleurisy  - DG Chest 2 View; Future  3. Acute cough  - Influenza A and B Ag, Immunoassay - promethazine-dextromethorphan (PROMETHAZINE-DM) 6.25-15 MG/5ML syrup; Take 5 mLs by mouth every 12 (twelve) hours as needed for cough. Do not take while driving or operating heavy machinery  Dispense: 118 mL; Refill: 0    Follow up plan: Return if symptoms worsen or fail to improve.   Due to the catastrophic nature of the COVID-19 pandemic, this video visit was completed soley via audio and visual contact via [employer] due to the restrictions of the COVID-19 pandemic.  All issues as above were discussed and addressed. Physical exam was done as above through visual confirmation on [employer]. If it was felt that the patient should be evaluated in the office, they were directed there. The patient verbally consented to this visit. Location of the patient: home Location of the provider: work Those involved with this call:  Provider: Noemi Chapel, DNP, FNP-C CMA:  n/a Front Desk/Registration: Santina Evans  Time spent on call:  15 minutes with patient face to face via video conference. More than 50% of this time was spent in counseling and coordination of care. 25 minutes total spent in review of patient's record and preparation of their chart. I verified patient identity using two factors (patient name and date of birth). Patient consents verbally to being seen via telemedicine visit today.

## 2021-07-31 NOTE — Telephone Encounter (Signed)
I will send Paxlovid to her pharmacy.  Please have her hold atorvastatin while on Paxlovid.

## 2021-07-31 NOTE — Telephone Encounter (Signed)
Patients symptoms started Tuesday night and she has test positive using home covid.  Please advise.

## 2021-07-31 NOTE — Telephone Encounter (Signed)
-----   Message from Eulogio Bear, NP sent at 07/31/2021  1:48 PM EST ----- Please notify rapid flu test negative.  Please encourage at home COVID test.  Continue with plan of care as discussed earlier today.

## 2021-08-01 NOTE — Telephone Encounter (Signed)
Patient aware.

## 2021-08-07 ENCOUNTER — Telehealth: Payer: Self-pay

## 2021-08-07 NOTE — Telephone Encounter (Signed)
Pt's daughter called in wanting to know if pt could get something for the lingering congestion pt is experiencing. Please advise.  Cb#: [phone]

## 2021-08-08 NOTE — Telephone Encounter (Signed)
Please let her know cough can last for a few weeks after acute viral illness.  Mucinex/guaifenesin and pushing hydration can help to break up mucus.  If she is coughing up mucus, we do not want to suppress cough.  If she is having other symptoms like ongoing fever, malaise, pain when breathing, please schedule office visit.

## 2021-08-08 NOTE — Telephone Encounter (Signed)
Spoke with patients daughter and advised to treat symptoms with mucinex/guaifenesin and stay hydrated.  Patient denies fever and breathing concerns.  Also advised if no better in a week to schedule follow up.

## 2021-12-16 ENCOUNTER — Encounter (HOSPITAL_BASED_OUTPATIENT_CLINIC_OR_DEPARTMENT_OTHER): Payer: Self-pay | Admitting: Nurse Practitioner

## 2021-12-16 ENCOUNTER — Ambulatory Visit (INDEPENDENT_AMBULATORY_CARE_PROVIDER_SITE_OTHER): Payer: 59 | Admitting: Nurse Practitioner

## 2021-12-16 VITALS — BP 128/82 | HR 65 | Ht 61.0 in | Wt 141.6 lb

## 2021-12-16 DIAGNOSIS — J330 Polyp of nasal cavity: Secondary | ICD-10-CM | POA: Diagnosis not present

## 2021-12-16 DIAGNOSIS — L84 Corns and callosities: Secondary | ICD-10-CM | POA: Insufficient documentation

## 2021-12-16 MED ORDER — MOMETASONE FUROATE 0.1 % EX CREA: TOPICAL_CREAM | CUTANEOUS | 1 refills | Status: DC

## 2021-12-16 MED ORDER — SALICYLIC ACID 17 % EX GEL: CUTANEOUS | 0 refills | Status: DC

## 2021-12-16 NOTE — Patient Instructions (Addendum)
Thank you for choosing [facility] for your Primary Care needs. I am excited for the opportunity to partner with you to meet your health care goals. It was a pleasure meeting you today! ? ?Recommendations from today's visit: ?I have sent the referral for Dr. Deeann Saint office for Ear Nose and Throat ?I have sent in the gel for the toe. Use this once a day and cover with a bandaid ?I have sent in the cream for the nose. Use this once to twice a day in a very thin layer.  ?If either of these are no better at all in two weeks, please let me know.  ? ?Information on diet, exercise, and health maintenance recommendations are listed below. This is information to help you be sure you are on track for optimal health and monitoring.  ? ?Please look over this and let us know if you have any questions or if you have completed any of the health maintenance outside of [facility] so that we can be sure your records are up to date.  ?___________________________________________________________ ?About Me: ?I am an Adult-Geriatric Nurse Practitioner with a background in caring for patients for more than 20 years with a strong intensive care background. I provide primary care and sports medicine services to patients age 17 and older within this office. My education had a strong focus on caring for the older adult population, which I am passionate about. I am also the director of the APP Fellowship with [facility].  ? ?My desire is to provide you with the best service through preventive medicine and supportive care. I consider you a part of the medical team and value your input. I work diligently to ensure that you are heard and your needs are met in a safe and effective manner. I want you to feel comfortable with me as your provider and want you to know that your health concerns are important to me. ? ?For your information, our office hours are: ?Monday, Tuesday, and Thursday 8:00 AM - 5:00  PM ?Wednesday and Friday 8:00 AM - 12:00 PM.  ? ?In my time away from the office I am teaching new APP's within the system and am unavailable, but my partner, Dr. Burnard Bunting is in the office for emergent needs.  ? ?If you have questions or concerns, please call our office at [phone] or send [place] a MyChart message and we will respond as quickly as possible.  ?____________________________________________________________ ?MyChart:  ?For all urgent or time sensitive needs we ask that you please call the office to avoid delays. Our number is [phone]. ?MyChart is not constantly monitored and due to the large volume of messages a day, replies may take up to 72 business hours. ? ?MyChart Policy: ?MyChart allows for you to see your visit notes, after visit summary, provider recommendations, lab and tests results, make an appointment, request refills, and contact your provider or the office for non-urgent questions or concerns. Providers are seeing patients during normal business hours and do not have built in time to review MyChart messages.  ?We ask that you allow a minimum of 3 business days for responses to [employer]. For this reason, please do not send urgent requests through [place]. Please call the office at [phone]. ?New and ongoing conditions may require a visit. We have virtual and in person visit available for your convenience.  ?Complex MyChart concerns may require a visit. Your provider may request you schedule a virtual or in person  visit to ensure we are providing the best care possible. ?MyChart messages sent after 11:00 AM on Friday will not be received by the provider until Monday morning.  ?  ?Lab and Test Results: ?You will receive your lab and test results on MyChart as soon as they are completed and results have been sent by the lab or testing facility. Due to this service, you will receive your results BEFORE your provider.  ?I review lab and tests results each morning prior to seeing  patients. Some results require collaboration with other providers to ensure you are receiving the most appropriate care. For this reason, we ask that you please allow a minimum of 3-5 business days from the time the ALL results have been received for your provider to receive and review lab and test results and contact you about these.  ?Most lab and test result comments from the provider will be sent through [place]. Your provider may recommend changes to the plan of care, follow-up visits, repeat testing, ask questions, or request an office visit to discuss these results. You may reply directly to this message or call the office at [phone] to provide information for the provider or set up an appointment. ?In some instances, you will be called with test results and recommendations. Please let us know if this is preferred and we will make note of this in your chart to provide this for you.    ?If you have not heard a response to your lab or test results in 5 business days from all results returning to [place], please call the office to let us know. We ask that you please avoid calling prior to this time unless there is an emergent concern. Due to high call volumes, this can delay the resulting process. ? ?After Hours: ?For all non-emergency after hours needs, please call the office at [phone] and select the option to reach the on-call provider service. On-call services are shared between multiple [facility] offices and therefore it will not be possible to speak directly with your provider. On-call providers may provide medical advice and recommendations, but are unable to provide refills for maintenance medications.  ?For all emergency or urgent medical needs after normal business hours, we recommend that you seek care at the closest Urgent Care or Emergency Department to ensure appropriate treatment in a timely manner.  ?[facility] has a 24 hour emergency room located on the ground  floor for your convenience.  ? ?Urgent Concerns During the Business Day ?Providers are seeing patients from 8AM to [place] with a busy schedule and are most often not able to respond to non-urgent calls until the end of the day or the next business day. ?If you should have URGENT concerns during the day, please call and speak to the nurse or schedule a same day appointment so that we can address your concern without delay.  ? ?Thank you, again, for choosing me as your health care partner. I appreciate your trust and look forward to learning more about you.  ? ?Worthy Keeler, DNP, AGNP-c ?___________________________________________________________ ? ?Health Maintenance Recommendations ?Screening Testing ?Mammogram ?Every 1 -2 years based on history and risk factors ?Starting at age 43 ?Pap Smear ?Ages 21-39 every 3 years ?Ages 86-65 every 5 years with HPV testing ?More frequent testing may be required based on results and history ?Colon Cancer Screening ?Every 1-10 years based on test performed, risk factors, and history ?Starting at age 69 ?Bone Density Screening ?Every 2-10 years based  on history ?Starting at age 92 for women ?Recommendations for men differ based on medication usage, history, and risk factors ?AAA Screening ?One time ultrasound ?Men 60-35 years old who have every smoked ?Lung Cancer Screening ?Low Dose Lung CT every 12 months ?Age 34-80 years with a 30 pack-year smoking history who still smoke or who have quit within the last 15 years ? ?Screening Labs ?Routine  Labs: Complete Blood Count (CBC), Complete Metabolic Panel (CMP), Cholesterol (Lipid Panel) ?Every 6-12 months based on history and medications ?May be recommended more frequently based on current conditions or previous results ?Hemoglobin A1c Lab ?Every 3-12 months based on history and previous results ?Starting at age 74 or earlier with diagnosis of diabetes, high cholesterol, BMI >26, and/or risk factors ?Frequent monitoring for patients  with diabetes to ensure blood sugar control ?Thyroid Panel (TSH w/ T3 & T4) ?Every 6 months based on history, symptoms, and risk factors ?May be repeated more often if on medication ?HIV ?One time testing for all

## 2021-12-16 NOTE — Assessment & Plan Note (Signed)
Medial portion of forth toe on right foot with thick, dry lesion appx 0.5cm in diameter present. Appears that toenail of third toe may rub area.  ?Recommend use of salicylate topically and cover with bandage to help remove the current lesion and avoid tight shoes to prevent rubbing of toes for further prevention.  ?If no improvement in 2 weeks, patient will f/u.Marland Kitchen  ?

## 2021-12-16 NOTE — Assessment & Plan Note (Signed)
Fleshy enlarged area of nasal mucosa on superior and anterior portion of the right nare. No signs of infection or drainage present at this time. Based on symptoms, suspect nasal polyp. Discussion option to trial treatment with topical corticosteroid today while awaiting ENT referral. Patient is agreeable to this. Will also swab area to ensure no presence of fungal or viral etiology present.  ?ENT referral placed and patient provided with information to contact the office for further evaluation.  ?

## 2021-12-16 NOTE — Progress Notes (Signed)
?[name], DNP, AGNP-c ?Primary Care & Sports Medicine ?[place], [place] [address] ?[phone] ? ?New patient visit ? ? ?Patient: Allison Baker   DOB: 31-Jul-1956   66 y.o. Female  MRN: [id number] ?Visit Date: 12/16/2021 ? ?Patient Care Team: ?Cadon Raczka, Coralee Pesa, NP as PCP - General (Nurse Practitioner) ? ?Today's Vitals  ? 12/16/21 1046  ?BP: 128/82  ?Pulse: 65  ?SpO2: 96%  ?Weight: 141 lb 9.6 oz (64.2 kg)  ?Height: '5\' 1"'$  (1.549 m)  ? ?Body mass index is 26.76 kg/m?.  ? ?Today's healthcare provider: [name], NP  ? ?Chief Complaint  ?Patient presents with  ? New Patient (Initial Visit)  ? sore on toe  ? nasal lesion  ? ?Subjective  ?  ?[name] is a 66 y.o. female who presents today as a new patient to establish care.  ?  ?Patient endorses the following concerns presently: ?Nasal Lesion ?- patient endorses swollen area on the mucosa within the right nare on the anterior/superior portion that has been present for more than 6 months.  ?- endorses seems to get larger and then decrease in size ?- occasionally itchy ?- no drainage, crusting, bleeding, or erythema present ?- has used topical home remedy but not sure if this is helping ? ?Sore on Toe ?- Right foot on forth toe on medial side ?- started out as red bump ?- very painful ?- no harder and dry appearing ?- has been using topical home remedy and seems to be improving ?- no injury to toe ?- no history of similar ? ?History reviewed and reveals the following: ?Past Medical History:  ?Diagnosis Date  ? Dyslipidemia   ? Palpitations   ? ?Past Surgical History:  ?Procedure Laterality Date  ? FRACTURE SURGERY  08/2013  ? HAND  ? ORIF WRIST FRACTURE Right 08/24/2013  ? Procedure: OPEN REDUCTION INTERNAL FIXATION (ORIF) RIGHT WRIST FRACTURE;  Surgeon: Linna Hoff, MD;  Location: [facility];  Service: Orthopedics;  Laterality: Right;  ? TUBAL LIGATION    ? ?Family Status  ?Relation Name Status  ? Mother  Deceased at age 42   ? Father  Deceased at age 72  ? Sister  Alive  ? Brother  Deceased  ? MGM  Deceased  ? MGF  Deceased  ? PGM  Deceased at age 22  ? PGF  Deceased  ? Sister  Alive  ? Sister  Alive  ? Brother  Alive  ? Brother  Alive  ? Neg Hx  (Not Specified)  ? ?Family History  ?Problem Relation Age of Onset  ? Cancer Father   ?     leukemia  ? Cancer Sister   ?     ovarian  ? Cancer Brother   ?     brain  ? Colon cancer Neg Hx   ? Esophageal cancer Neg Hx   ? Rectal cancer Neg Hx   ? Stomach cancer Neg Hx   ? ?Social History  ? ?Socioeconomic History  ? Marital status: Single  ?  Spouse name: Not on file  ? Number of children: Not on file  ? Years of education: Not on file  ? Highest education level: Not on file  ?Occupational History  ? Not on file  ?Tobacco Use  ? Smoking status: Some Days  ?  Packs/day: 0.00  ?  Types: Cigarettes  ? Smokeless tobacco: Never  ? Tobacco comments:  ?  maybe 1-2  per day  ?Vaping Use  ? Vaping Use: Never used  ?Substance and Sexual Activity  ? Alcohol use: No  ? Drug use: No  ? Sexual activity: Not Currently  ?Other Topics Concern  ? Not on file  ?Social History Narrative  ? Lives daughter.    ? ?Social Determinants of Health  ? ?Financial Resource Strain: Not on file  ?Food Insecurity: Not on file  ?Transportation Needs: Not on file  ?Physical Activity: Not on file  ?Stress: Not on file  ?Social Connections: Not on file  ? ?Outpatient Medications Prior to Visit  ?Medication Sig  ? Multiple Vitamin (MULTIVITAMIN WITH MINERALS) TABS tablet Take 1 tablet by mouth daily.  ? promethazine-dextromethorphan (PROMETHAZINE-DM) 6.25-15 MG/5ML syrup Take 5 mLs by mouth every 12 (twelve) hours as needed for cough. Do not take while driving or operating heavy machinery  ? [DISCONTINUED] atorvastatin (LIPITOR) 10 MG tablet Take 1 tablet (10 mg total) by mouth daily. (Patient not taking: Reported on 12/16/2021)  ? [DISCONTINUED] fluticasone (FLONASE) 50 MCG/ACT nasal spray Place 2 sprays into both nostrils daily.  (Patient not taking: Reported on 12/16/2021)  ? [DISCONTINUED] loratadine (CLARITIN) 10 MG tablet Take 10 mg by mouth as needed. (Patient not taking: Reported on 12/16/2021)  ? [DISCONTINUED] neomycin-polymyxin b-dexamethasone (MAXITROL) 3.5-10000-0.1 SUSP Place 1 drop into the right eye 3 (three) times daily as needed. (Patient not taking: Reported on 12/16/2021)  ? ?No facility-administered medications prior to visit.  ? ?No Known Allergies ?Immunization History  ?Administered Date(s) Administered  ? Fluad Quad(high Dose 65+) 07/17/2020, 04/28/2021  ? Influenza Inj Mdck Quad Pf 08/30/2018  ? Influenza,inj,Quad PF,6+ Mos 04/04/2019  ? PFIZER(Purple Top)SARS-COV-2 Vaccination 10/19/2019, 11/09/2019  ? Pneumococcal Conjugate-13 04/14/2021  ? Tdap 10/31/2015, 11/30/2020  ? ? ?Review of Systems ?All review of systems negative except what is listed in the HPI ? ? Objective  ?  ?BP 128/82   Pulse 65   Ht '5\' 1"'$  (1.549 m)   Wt 141 lb 9.6 oz (64.2 kg)   SpO2 96%   BMI 26.76 kg/m?  ?Physical Exam ?Vitals and nursing note reviewed.  ?Constitutional:   ?   General: She is not in acute distress. ?   Appearance: Normal appearance.  ?HENT:  ?   Right Ear: Tympanic membrane normal.  ?   Left Ear: Tympanic membrane normal.  ?   Nose: Mucosal edema present. No signs of injury, laceration or congestion.  ? ?   Mouth/Throat:  ?   Mouth: Mucous membranes are moist.  ?   Pharynx: Oropharynx is clear.  ?Eyes:  ?   Extraocular Movements: Extraocular movements intact.  ?   Conjunctiva/sclera: Conjunctivae normal.  ?   Pupils: Pupils are equal, round, and reactive to light.  ?Neck:  ?   Vascular: No carotid bruit.  ?Cardiovascular:  ?   Rate and Rhythm: Normal rate and regular rhythm.  ?   Pulses: Normal pulses.  ?   Heart sounds: Normal heart sounds. No murmur heard. ?Pulmonary:  ?   Effort: Pulmonary effort is normal.  ?   Breath sounds: Normal breath sounds. No wheezing.  ?Abdominal:  ?   General: Bowel sounds are normal.  ?    Palpations: Abdomen is soft.  ?Musculoskeletal:     ?   General: Normal range of motion.  ?   Cervical back: Normal range of motion.  ?   Right lower leg: No edema.  ?   Left lower leg: No edema.  ?Skin: ?  General: Skin is warm and dry.  ?   Capillary Refill: Capillary refill takes less than 2 seconds.  ?Neurological:  ?   General: No focal deficit present.  ?   Mental Status: She is alert and oriented to person, place, and time.  ?Psychiatric:     ?   Mood and Affect: Mood normal.     ?   Behavior: Behavior normal.     ?   Thought Content: Thought content normal.     ?   Judgment: Judgment normal.  ? ? ?No results found for any visits on 12/16/21. ? Assessment & Plan   ?  ? ?Problem List Items Addressed This Visit   ? ? Nasal cavity polyp - Primary  ?  Fleshy enlarged area of nasal mucosa on superior and anterior portion of the right nare. No signs of infection or drainage present at this time. Based on symptoms, suspect nasal polyp. Discussion option to trial treatment with topical corticosteroid today while awaiting ENT referral. Patient is agreeable to this. Will also swab area to ensure no presence of fungal or viral etiology present.  ?ENT referral placed and patient provided with information to contact the office for further evaluation.  ? ?  ?  ? Relevant Medications  ? mometasone (ELOCON) 0.1 % cream  ? Other Relevant Orders  ? Ambulatory referral to ENT  ? Corn of toe  ?  Medial portion of forth toe on right foot with thick, dry lesion appx 0.5cm in diameter present. Appears that toenail of third toe may rub area.  ?Recommend use of salicylate topically and cover with bandage to help remove the current lesion and avoid tight shoes to prevent rubbing of toes for further prevention.  ?If no improvement in 2 weeks, patient will f/u..  ? ?  ?  ? Relevant Medications  ? salicylic acid 17 % gel  ? ? ? ?Return for After 09/26 for CPE.  ?  ? ? ?Ayasha Ellingsen, Coralee Pesa, NP, DNP, AGNP-C ?Primary Care & Sports Medicine at  [facility] ?[facility]  ? ?

## 2021-12-27 ENCOUNTER — Emergency Department (HOSPITAL_BASED_OUTPATIENT_CLINIC_OR_DEPARTMENT_OTHER)
Admission: EM | Admit: 2021-12-27 | Discharge: 2021-12-27 | Disposition: A | Discharge status: Home / Self Care | Payer: 59 | Attending: Emergency Medicine | Admitting: Emergency Medicine

## 2021-12-27 ENCOUNTER — Other Ambulatory Visit: Payer: Self-pay

## 2021-12-27 ENCOUNTER — Encounter (HOSPITAL_BASED_OUTPATIENT_CLINIC_OR_DEPARTMENT_OTHER): Payer: Self-pay

## 2021-12-27 DIAGNOSIS — G8929 Other chronic pain: Secondary | ICD-10-CM | POA: Insufficient documentation

## 2021-12-27 DIAGNOSIS — M546 Pain in thoracic spine: Secondary | ICD-10-CM | POA: Diagnosis present

## 2021-12-27 MED ORDER — MELOXICAM 7.5 MG PO TABS: 7.5000 mg | ORAL_TABLET | Freq: Every day | ORAL | 0 refills | Status: DC

## 2021-12-27 MED ORDER — METHOCARBAMOL 500 MG PO TABS: 500.0000 mg | ORAL_TABLET | Freq: Two times a day (BID) | ORAL | 0 refills | Status: DC

## 2021-12-27 NOTE — ED Provider Notes (Signed)
[facility] EMERGENCY DEPT Provider Note   CSN: [id number] Arrival date & time: 12/27/21  1350     History  Chief Complaint  Patient presents with   Back Pain    Allison Baker is a 66 y.o. female with past medical history significant for chronic thoracic back pain who presents with acute on chronic thoracic back pain since Wednesday this week.  Patient reports that she does not recall any specific injury but has been chasing around her grandchildren which she thinks may have exacerbated it.  Patient reports that when it was worse in the past she tried 2 medications, on chart review patient confirms that this was Mobic and a muscle relaxant.  She denies history of cancer, IV drug use, chronic corticosteroid use, back pain waking her up from sleep, recent fever.  She denies any weakness, numbness, saddle anesthesia, difficulty with urination or defecation.   Back Pain     Home Medications Prior to Admission medications   Medication Sig Start Date End Date Taking? Authorizing Provider  meloxicam (MOBIC) 7.5 MG tablet Take 1 tablet (7.5 mg total) by mouth daily. 12/27/21  Yes Gala Padovano H, PA-C  methocarbamol (ROBAXIN) 500 MG tablet Take 1 tablet (500 mg total) by mouth 2 (two) times daily. 12/27/21  Yes Japhet Morgenthaler H, PA-C  mometasone (ELOCON) 0.1 % cream Apply thin layer once daily to the inside of the nose on the polyp for 7 days then stop 3 days. Repeat if still present. 12/16/21   [name], NP  Multiple Vitamin (MULTIVITAMIN WITH MINERALS) TABS tablet Take 1 tablet by mouth daily.    [provider]  promethazine-dextromethorphan (PROMETHAZINE-DM) 6.25-15 MG/5ML syrup Take 5 mLs by mouth every 12 (twelve) hours as needed for cough. Do not take while driving or operating heavy machinery 07/31/21   Noemi Chapel A, NP  salicylic acid 17 % gel Apply to toe once a day until healed. If no improvement in 2 weeks, please contact provider. 12/16/21    [name], NP      Allergies    Patient has no known allergies.    Review of Systems   Review of Systems  Musculoskeletal:  Positive for back pain.  All other systems reviewed and are negative.  Physical Exam Updated Vital Signs BP (!) 120/58 (BP Location: Right Arm)   Pulse (!) 58   Temp 99 F (37.2 C) (Oral)   Resp 16   SpO2 100%  Physical Exam Vitals and nursing note reviewed.  Constitutional:      General: She is not in acute distress.    Appearance: Normal appearance.  HENT:     Head: Normocephalic and atraumatic.  Eyes:     General:        Right eye: No discharge.        Left eye: No discharge.  Cardiovascular:     Rate and Rhythm: Normal rate and regular rhythm.     Pulses: Normal pulses.  Pulmonary:     Effort: Pulmonary effort is normal. No respiratory distress.  Musculoskeletal:        General: No deformity.     Comments: Intact strength 5 out of 5 bilateral upper and lower extremities.  No midline spinal tenderness in cervical, thoracic, lumbar spines.  Patient does have some tenderness palpation of the paraspinous muscles in the thoracic region.  Skin:    General: Skin is warm and dry.     Capillary Refill: Capillary refill takes less than  2 seconds.  Neurological:     Mental Status: She is alert and oriented to person, place, and time.  Psychiatric:        Mood and Affect: Mood normal.        Behavior: Behavior normal.    ED Results / Procedures / Treatments   Labs (all labs ordered are listed, but only abnormal results are displayed) Labs Reviewed - No data to display  EKG None  Radiology No results found.  Procedures Procedures    Medications Ordered in ED Medications - No data to display  ED Course/ Medical Decision Making/ A&P                           Medical Decision Making Risk Prescription drug management.   Patient with acute on chronic thoracic back pain.  My emergent differential diagnosis includes slipped disc,  compression fracture, spondylolisthesis, less clinical concern for epidural abscess or osteomyelitis based on patient history.  No neurological deficits. Patient is ambulatory. No warning symptoms of back pain including: fecal incontinence, urinary retention or overflow incontinence, night sweats, waking from sleep with back pain, unexplained fevers or weight loss, h/o cancer, IVDU, recent trauma. No concern for cauda equina, epidural abscess, or other serious cause of back pain.  Given this work-up, evaluation, physical exam I do not believe that radiographic imaging is indicated at this time.  Conservative measures such as rest, ice/heat, ibuprofen, Tylenol, and  prescription for Robaxin indicated with orthopedic follow-up if no improvement with conservative management.  Discussed did not recommend steroids at this time his back pain has been ongoing for less than a week.  Extensive return precautions given, patient discharged in stable condition at this time.  Final Clinical Impression(s) / ED Diagnoses Final diagnoses:  Chronic bilateral thoracic back pain    Rx / DC Orders ED Discharge Orders          Ordered    meloxicam (MOBIC) 7.5 MG tablet  Daily        12/27/21 1510    methocarbamol (ROBAXIN) 500 MG tablet  2 times daily        12/27/21 1510              Yasmyn Bellisario, [place] H, PA-C 12/27/21 [place], Ankit, MD 12/28/21 [phone]

## 2021-12-27 NOTE — ED Notes (Signed)
Dc instructions reviewed with patient. Patient voiced understanding. Dc with belongings.  °

## 2021-12-27 NOTE — ED Triage Notes (Signed)
She c/o non-traumatic, non-radiating mid and low back pain x 1 week. She denies fevr/N/V/D/dysuria and is in no distress. She tells me she "had this a couple of years ago and they gave me something for pain and a muscle relaxant." She is ambulatory and in no distress.

## 2021-12-27 NOTE — Discharge Instructions (Addendum)
Please use Tylenol or ibuprofen for pain.  You may use 600 mg ibuprofen every 6 hours or 1000 mg of Tylenol every 6 hours.  You may choose to alternate between the 2.  This would be most effective.  Not to exceed 4 g of Tylenol within 24 hours.  Not to exceed 3200 mg ibuprofen 24 hours.  You can use the meloxicam instead of ibuprofen once daily, as well as the muscle relaxant and I am prescribing up to twice daily.

## 2021-12-30 ENCOUNTER — Encounter (HOSPITAL_BASED_OUTPATIENT_CLINIC_OR_DEPARTMENT_OTHER): Payer: Self-pay | Admitting: Nurse Practitioner

## 2021-12-30 ENCOUNTER — Ambulatory Visit (INDEPENDENT_AMBULATORY_CARE_PROVIDER_SITE_OTHER): Payer: 59 | Admitting: Nurse Practitioner

## 2021-12-30 VITALS — BP 120/70 | HR 68 | Ht 64.0 in | Wt 141.2 lb

## 2021-12-30 DIAGNOSIS — M546 Pain in thoracic spine: Secondary | ICD-10-CM

## 2021-12-30 DIAGNOSIS — S29019A Strain of muscle and tendon of unspecified wall of thorax, initial encounter: Secondary | ICD-10-CM

## 2021-12-30 HISTORY — DX: Strain of muscle and tendon of unspecified wall of thorax, initial encounter: S29.019A

## 2021-12-30 MED ORDER — CYCLOBENZAPRINE HCL 10 MG PO TABS: 5.0000 mg | ORAL_TABLET | Freq: Three times a day (TID) | ORAL | 0 refills | Status: DC | PRN

## 2021-12-30 NOTE — Assessment & Plan Note (Signed)
Spasms and decreased ROM present to the thoracic and lumbar spine. No tenderness to palpation or percussion. Pain improved with rest. Recommend heat to the back 20 minutes at a time several times a day. Rest for at least one more day. Will change robaxin to flexeril. Will send to PT for strengthening core muscles, which I suspect are contributing to the spasms and pain.  No alarm sx present.

## 2021-12-30 NOTE — Assessment & Plan Note (Signed)
>>  ASSESSMENT AND PLAN FOR ACUTE BILATERAL THORACIC BACK PAIN WRITTEN ON 12/30/2021  1:24 PM BY Trystian Crisanto E, NP  Spasms and decreased ROM present to the thoracic and lumbar spine. No tenderness to palpation or percussion. Pain improved with rest. Recommend heat to the back 20 minutes at a time several times a day. Rest for at least one more day. Will change robaxin to flexeril. Will send to PT for strengthening core muscles, which I suspect are contributing to the spasms and pain.  No alarm sx present.

## 2021-12-30 NOTE — Patient Instructions (Addendum)
He enviado una referencia para terapia fsica. Esto le llamar para programar esto.  Me gustara que te tomaras el mircoles libre para que descanses un poco ms la espalda.  No intente hacer demasiado para evitar que vuelva a empeorar.  STOP methocarbamol START cyclobenzaprine CONTINUE meloxicam

## 2021-12-30 NOTE — Progress Notes (Signed)
  [name], DNP, AGNP-c [facility] [address]  [place], [place] [address] [phone]  Subjective:   Allison Baker is a 66 y.o. female presents to day for: Thoracic back pain Started last Wednesday. Pain with movement and lifting No pain when laying flat and resting Was seen in the ED and given meloxicam and robaxin Not helping at all Did have massage yesterday and this helped significantly Spasms noted, specifically with bending No injury known Has happened in the past and eventually worked it's way out Has been out of work since Saturday due to the pain and ROM limitations  PMH, Medications, and Allergies reviewed and updated in chart.   ROS negative except for what is listed in HPI. Objective:  BP 120/70   Pulse 68   Ht '5\' 4"'$  (1.626 m)   Wt 141 lb 3.2 oz (64 kg)   SpO2 96%   BMI 24.24 kg/m  Physical Exam Vitals and nursing note reviewed.  Constitutional:      Appearance: Normal appearance.  HENT:     Head: Normocephalic.  Eyes:     Extraocular Movements: Extraocular movements intact.     Conjunctiva/sclera: Conjunctivae normal.     Pupils: Pupils are equal, round, and reactive to light.  Cardiovascular:     Rate and Rhythm: Normal rate and regular rhythm.  Pulmonary:     Effort: Pulmonary effort is normal.  Musculoskeletal:     Cervical back: Normal range of motion. No rigidity or tenderness.     Thoracic back: Spasms present. Decreased range of motion.     Lumbar back: Spasms present. Decreased range of motion. Negative right straight leg raise test and negative left straight leg raise test.       Back:     Comments: Area of pain with movement. No pain to palpation or percussion.   Skin:    General: Skin is warm and dry.  Neurological:     General: No focal deficit present.     Mental Status: She is alert and oriented to person, place, and time.     Sensory: No sensory deficit.     Coordination:  Coordination normal.     Gait: Gait normal.  Psychiatric:        Mood and Affect: Mood normal.        Behavior: Behavior normal.        Thought Content: Thought content normal.        Judgment: Judgment normal.          Assessment & Plan:   Problem List Items Addressed This Visit     Acute bilateral thoracic back pain - Primary    Spasms and decreased ROM present to the thoracic and lumbar spine. No tenderness to palpation or percussion. Pain improved with rest. Recommend heat to the back 20 minutes at a time several times a day. Rest for at least one more day. Will change robaxin to flexeril. Will send to PT for strengthening core muscles, which I suspect are contributing to the spasms and pain.  No alarm sx present.        Relevant Medications   cyclobenzaprine (FLEXERIL) 10 MG tablet   Other Relevant Orders   Ambulatory referral to Physical Therapy   Return if symptoms worsen or fail to improve.   [name], DNP, AGNP-c 12/30/2021  1:24 PM

## 2022-02-10 ENCOUNTER — Ambulatory Visit (HOSPITAL_BASED_OUTPATIENT_CLINIC_OR_DEPARTMENT_OTHER): Payer: 59 | Admitting: Nurse Practitioner

## 2022-02-20 ENCOUNTER — Encounter (HOSPITAL_BASED_OUTPATIENT_CLINIC_OR_DEPARTMENT_OTHER): Payer: Self-pay | Admitting: Nurse Practitioner

## 2022-03-12 ENCOUNTER — Ambulatory Visit (HOSPITAL_BASED_OUTPATIENT_CLINIC_OR_DEPARTMENT_OTHER): Payer: 59 | Admitting: Nurse Practitioner

## 2022-03-31 ENCOUNTER — Ambulatory Visit (INDEPENDENT_AMBULATORY_CARE_PROVIDER_SITE_OTHER): Payer: 59 | Admitting: Nurse Practitioner

## 2022-03-31 ENCOUNTER — Encounter (HOSPITAL_BASED_OUTPATIENT_CLINIC_OR_DEPARTMENT_OTHER): Payer: Self-pay | Admitting: Nurse Practitioner

## 2022-03-31 VITALS — BP 113/56 | HR 68 | Ht 62.0 in | Wt 140.0 lb

## 2022-03-31 DIAGNOSIS — L03012 Cellulitis of left finger: Secondary | ICD-10-CM | POA: Diagnosis not present

## 2022-03-31 DIAGNOSIS — K649 Unspecified hemorrhoids: Secondary | ICD-10-CM

## 2022-03-31 NOTE — Patient Instructions (Addendum)
I have sent the referral to the gastroenterologist so they can make sure that your symptoms are coming from hemorrhoids. If you have any new hemorrhoids come up before you see them, please let me know and we can send in medication to help.   Please contact me if you have any changes or new concerns.   I have sent the referral to [facility]  [place], [place]: [phone] Fax: [phone]

## 2022-03-31 NOTE — Progress Notes (Signed)
Worthy Keeler, DNP, AGNP-c [place], [place] [address] [phone] Office [phone] Fax  ESTABLISHED PATIENT- Chronic Health and/or Follow-Up Visit  Blood pressure (!) 113/56, pulse 68, height '5\' 2"'$  (1.575 m), weight 140 lb (63.5 kg), SpO2 99 %.  HPI  Allison Baker  is a 66 y.o. year old female presenting today for evaluation and management of the following: Follow-up (Hemorrhoids are better, she feels pain once in with bowel moment.)  Hemorrhoids She had a colonoscopy about 2 years ago and everything was fine She recently had an external hemorrhoid with BRB noted on toilet paper, but used a natural cream remedy on the rectum and this shrunk and went away.  She tells me that she does have some pressure in the rectal area on the inside with BM. She would like to continue her natural treatment. She denies any bleeding, itching, or burning at this time Nail infection She was cutting her finger nails and accidentally cut the medial edge of the cuticle She endorses warmth, swelling, redness, and pain to the area There has been a small amount of purulent drainage.    ROS All ROS negative with exception of what is listed in HPI  PHYSICAL EXAM Physical Exam Vitals and nursing note reviewed.  Constitutional:      General: She is not in acute distress.    Appearance: Normal appearance.  HENT:     Head: Normocephalic.  Eyes:     Extraocular Movements: Extraocular movements intact.     Conjunctiva/sclera: Conjunctivae normal.     Pupils: Pupils are equal, round, and reactive to light.  Neck:     Vascular: No carotid bruit.  Cardiovascular:     Rate and Rhythm: Normal rate and regular rhythm.     Pulses: Normal pulses.     Heart sounds: Normal heart sounds. No murmur heard. Pulmonary:     Effort: Pulmonary effort is normal.     Breath sounds: Normal breath sounds. No wheezing.  Abdominal:     General:  Bowel sounds are normal. There is no distension.     Palpations: Abdomen is soft.     Tenderness: There is no abdominal tenderness. There is no guarding.  Musculoskeletal:        General: Normal range of motion.     Cervical back: Normal range of motion and neck supple.     Right lower leg: No edema.     Left lower leg: No edema.  Lymphadenopathy:     Cervical: No cervical adenopathy.  Skin:    General: Skin is warm and dry.     Capillary Refill: Capillary refill takes less than 2 seconds.     Comments: Erythema, edema, warmth, and yellow drainage present on the medial side of the third finger on the left hand consistent with paronychia. Gentle pressure expresses purulent drainage. No systemic symptoms noted.   Neurological:     General: No focal deficit present.     Mental Status: She is alert and oriented to person, place, and time.  Psychiatric:        Mood and Affect: Mood normal.        Behavior: Behavior normal.        Thought Content: Thought content normal.        Judgment: Judgment normal.     ASSESSMENT & PLAN Problem List Items Addressed This Visit     Hemorrhoids - Primary    Given her symptoms  of rectal fullness and recent BRB with BM, recommend referral to GI for evaluation of hemorrhoids. Likely internal hemorrhoid causing complications which may need intervention. Referral placed today and information for patient provided to contact that office. No alarm sx present at this time.       Relevant Orders   Ambulatory referral to Gastroenterology   Paronychia of finger of left hand    Symptoms consistent with paronychia with localized infection. Gentle pressure applied and area drained with immediate relief. Recommend keeping area clean and dry. Area cleansed and mupirocin placed with bandage. Patient supplied with additional ointment and bandages with instructions to change once a day or when wet or soiled for 5-7 days. F/U if symptoms worsen or fail to improve.          FOLLOW-UP: PRN   Worthy Keeler, DNP, AGNP-c 03/31/2022 10:04 AM

## 2022-05-02 ENCOUNTER — Encounter (HOSPITAL_BASED_OUTPATIENT_CLINIC_OR_DEPARTMENT_OTHER): Payer: Self-pay | Admitting: Nurse Practitioner

## 2022-05-02 DIAGNOSIS — L03012 Cellulitis of left finger: Secondary | ICD-10-CM | POA: Insufficient documentation

## 2022-05-02 DIAGNOSIS — K649 Unspecified hemorrhoids: Secondary | ICD-10-CM | POA: Insufficient documentation

## 2022-05-02 NOTE — Assessment & Plan Note (Signed)
Symptoms consistent with paronychia with localized infection. Gentle pressure applied and area drained with immediate relief. Recommend keeping area clean and dry. Area cleansed and mupirocin placed with bandage. Patient supplied with additional ointment and bandages with instructions to change once a day or when wet or soiled for 5-7 days. F/U if symptoms worsen or fail to improve.

## 2022-05-02 NOTE — Assessment & Plan Note (Signed)
Given her symptoms of rectal fullness and recent BRB with BM, recommend referral to GI for evaluation of hemorrhoids. Likely internal hemorrhoid causing complications which may need intervention. Referral placed today and information for patient provided to contact that office. No alarm sx present at this time.

## 2022-09-01 ENCOUNTER — Encounter: Payer: Self-pay | Admitting: Nurse Practitioner

## 2022-09-01 ENCOUNTER — Ambulatory Visit (INDEPENDENT_AMBULATORY_CARE_PROVIDER_SITE_OTHER): Payer: 59 | Admitting: Nurse Practitioner

## 2022-09-01 VITALS — BP 100/60 | HR 66 | Ht 62.0 in | Wt 137.8 lb

## 2022-09-01 DIAGNOSIS — M546 Pain in thoracic spine: Secondary | ICD-10-CM

## 2022-09-01 DIAGNOSIS — M545 Low back pain, unspecified: Secondary | ICD-10-CM

## 2022-09-01 DIAGNOSIS — Z23 Encounter for immunization: Secondary | ICD-10-CM | POA: Diagnosis not present

## 2022-09-01 MED ORDER — TRAMADOL HCL 50 MG PO TABS: 50.0000 mg | ORAL_TABLET | Freq: Four times a day (QID) | ORAL | 0 refills | Status: AC | PRN

## 2022-09-01 MED ORDER — CYCLOBENZAPRINE HCL 10 MG PO TABS: 5.0000 mg | ORAL_TABLET | Freq: Every day | ORAL | 0 refills | Status: DC

## 2022-09-01 NOTE — Progress Notes (Signed)
[name], DNP, AGNP-c [facility] [address] [place], [place] [address] [phone]  Subjective:   Allison Baker is a 67 y.o. female presents to day for evaluation of: [facility] reports mid back pain that has been present since Saturday. 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 She has radiation of the pain through the sacrum. She does not have radiation into her legs. She has no weakness. She has been taking tylenol and meloxicam, but this is not helping with the pain.   PMH, Medications, and Allergies reviewed and updated in chart as appropriate.   ROS negative except for what is listed in HPI. Objective:  BP 100/60   Pulse 66   Ht '5\' 2"'$  (1.575 m)   Wt 137 lb 12.8 oz (62.5 kg)   BMI 25.20 kg/m  Physical Exam Vitals and nursing note reviewed.  Constitutional:      Appearance: Normal appearance.  HENT:     Head: Normocephalic.  Cardiovascular:     Rate and Rhythm: Normal rate and regular rhythm.     Pulses: Normal pulses.     Heart sounds: Normal heart sounds.  Pulmonary:     Effort: Pulmonary effort is normal.     Breath sounds: Normal breath sounds.  Musculoskeletal:        General: Tenderness present.     Cervical back: Normal range of motion and neck supple.     Right lower leg: No edema.     Left lower leg: No edema.     Comments: Tenderness along the midline of the thoracic spine down through sacral region. No ecchymosis or edema noted. ROM is very limited with lateral flexion, rotation, and extension and flexion.   Skin:    General: Skin is warm and dry.     Capillary Refill: Capillary refill takes less than 2 seconds.  Neurological:     General: No focal deficit present.     Mental Status: She is alert and oriented to person, place, and time.           Assessment & Plan:   Problem List Items Addressed This Visit     Acute bilateral  thoracic back pain    Repeat exacerbation of thoracic back pain following extensive standing and lifting at work over the weekend. She has had a similar reaction in the past. No neurological symptoms or weakness present. No saddle symptoms. She does have decreased ROM and pain with movement. We will start flexeril and short term tramadol for pain. Recommend rest, ice, heat, and gentle stretches (provided) in 2-3 days. F/U if no improvement. Will likely need PT referral.       Relevant Medications   meloxicam (MOBIC) 7.5 MG tablet   acetaminophen (TYLENOL) 500 MG tablet   cyclobenzaprine (FLEXERIL) 10 MG tablet   Acute bilateral low back pain without sciatica - Primary   Relevant Medications   meloxicam (MOBIC) 7.5 MG tablet   acetaminophen (TYLENOL) 500 MG tablet   cyclobenzaprine (FLEXERIL) 10 MG tablet   Other Visit Diagnoses     Need for COVID-19 vaccine       Relevant Orders   [employer] Fall 2023 Covid-19 Vaccine 39yr and older (Completed)         SOrma Render DNP, AGNP-c 09/10/2022  8:59 PM    History, Medications, Surgery, SDOH, and Family History reviewed and  updated as appropriate.

## 2022-09-10 DIAGNOSIS — M545 Low back pain, unspecified: Secondary | ICD-10-CM

## 2022-09-10 HISTORY — DX: Low back pain, unspecified: M54.50

## 2022-09-10 NOTE — Assessment & Plan Note (Signed)
Repeat exacerbation of thoracic back pain following extensive standing and lifting at work over the weekend. She has had a similar reaction in the past. No neurological symptoms or weakness present. No saddle symptoms. She does have decreased ROM and pain with movement. We will start flexeril and short term tramadol for pain. Recommend rest, ice, heat, and gentle stretches (provided) in 2-3 days. F/U if no improvement. Will likely need PT referral.

## 2022-09-10 NOTE — Assessment & Plan Note (Signed)
>>  ASSESSMENT AND PLAN FOR ACUTE BILATERAL THORACIC BACK PAIN WRITTEN ON 09/10/2022  8:59 PM BY Jamyron Redd E, NP  Repeat exacerbation of thoracic back pain following extensive standing and lifting at work over the weekend. She has had a similar reaction in the past. No neurological symptoms or weakness present. No saddle symptoms. She does have decreased ROM and pain with movement. We will start flexeril and short term tramadol for pain. Recommend rest, ice, heat, and gentle stretches (provided) in 2-3 days. F/U if no improvement. Will likely need PT referral.

## 2022-10-06 ENCOUNTER — Telehealth (INDEPENDENT_AMBULATORY_CARE_PROVIDER_SITE_OTHER): Payer: 59 | Admitting: Medical

## 2022-10-06 ENCOUNTER — Other Ambulatory Visit: Payer: 59

## 2022-10-06 ENCOUNTER — Encounter: Payer: Self-pay | Admitting: Medical

## 2022-10-06 VITALS — BP 110/70 | HR 61 | Temp 98.2°F | Wt 140.2 lb

## 2022-10-06 DIAGNOSIS — H9209 Otalgia, unspecified ear: Secondary | ICD-10-CM | POA: Diagnosis not present

## 2022-10-06 DIAGNOSIS — R051 Acute cough: Secondary | ICD-10-CM

## 2022-10-06 DIAGNOSIS — R509 Fever, unspecified: Secondary | ICD-10-CM | POA: Diagnosis not present

## 2022-10-06 DIAGNOSIS — J3489 Other specified disorders of nose and nasal sinuses: Secondary | ICD-10-CM | POA: Diagnosis not present

## 2022-10-06 DIAGNOSIS — R6889 Other general symptoms and signs: Secondary | ICD-10-CM

## 2022-10-06 DIAGNOSIS — H6690 Otitis media, unspecified, unspecified ear: Secondary | ICD-10-CM

## 2022-10-06 LAB — POCT INFLUENZA A/B
Influenza A, POC: NEGATIVE
Influenza B, POC: NEGATIVE

## 2022-10-06 LAB — POC COVID19 BINAXNOW: SARS Coronavirus 2 Ag: NEGATIVE

## 2022-10-06 MED ORDER — DM-GUAIFENESIN ER 30-600 MG PO TB12: 1.0000 | ORAL_TABLET | Freq: Two times a day (BID) | ORAL | 0 refills | Status: DC

## 2022-10-06 MED ORDER — AMOXICILLIN 875 MG PO TABS: 875.0000 mg | ORAL_TABLET | Freq: Two times a day (BID) | ORAL | 0 refills | Status: AC

## 2022-10-06 NOTE — Progress Notes (Addendum)
Subjective:     Patient ID: Allison Baker, female   DOB: [birth year], 67 y.o.   MRN: [id number]  This visit type was conducted due to national recommendations for restrictions regarding the COVID-19 Pandemic (e.g. social distancing) in an effort to limit this patient's exposure and mitigate transmission in our community.  Due to their co-morbid illnesses, this patient is at least at moderate risk for complications without adequate follow up.  This format is felt to be most appropriate for this patient at this time.    Documentation for virtual audio and video telecommunications through [facility] encounter:  The patient was located at home. The provider was located in the office. The patient did consent to this visit and is aware of possible charges through their insurance for this visit.  The other persons participating in this telemedicine service were [name] daughter. Time spent on call was 20 minutes and in review of previous records 20 minutes total.  This virtual service is not related to other E/M service within previous 7 days.   HPI Chief Complaint  Patient presents with   sick    Sick- ear pain, back pain, chills, coughing off and on, low grade fever and nasal congestion, sick since Saturday. Covid negative Sunday night   Virtual for illness.  Her daughter [name] interprets.  She has 3 days of illness including chills, cough, bad runny nose, low grade fever, some nasal congestion, upper back ache, ear pain, sinus pressure.   No sore throat. No NVD.  Some SOB.   Using tylenol.  Smokes limited, typically 1 cigarette/day.  No prior inhaler use.  No sick contacts.  She will need a doctor's note for work.  Rarely gets sick.  No other aggravating or relieving factors. No other complaint.   Past Medical History:  Diagnosis Date   Dyslipidemia    Palpitations    Current Outpatient Medications on File Prior to Visit  Medication Sig Dispense Refill   acetaminophen (TYLENOL) 500  MG tablet Take 500 mg by mouth every 6 (six) hours as needed.     No current facility-administered medications on file prior to visit.      Review of Systems As in subjective    Objective:   Physical Exam Due to coronavirus pandemic stay at home measures, patient visit was virtual and they were not examined in person.   BP 110/70   Pulse 61   Temp 98.2 F (36.8 C)   Wt 140 lb 3.2 oz (63.6 kg)   SpO2 98%   BMI 25.64 kg/m   In person exam: General appearence: alert, no distress, WD/WN,  HEENT: normocephalic, sclerae anicteric, TMs with erythema, nares patent, no discharge or erythema, pharynx with mild erythema Oral cavity: MMM, no lesions Neck: supple, no lymphadenopathy, no thyromegaly, no masses Heart: RRR, normal S1, S2, no murmurs Lungs: CTA bilaterally, no wheezes, rhonchi, or rales Pulses: 2+ symmetric, upper and lower extremities, normal cap refill       Assessment:     Encounter Diagnoses  Name Primary?   Flu-like symptoms Yes   Otalgia, unspecified laterality    Acute cough    Acute otitis media, unspecified otitis media type    Sinus pressure        Plan:     We discussed symptoms and exam findings.   She came to our office to do testing in the back parking lot.   Negative for COVID and flu.   I brought her in person to do a  in person exam  positive for ear infection and respiratory tract infection.  Begin medication as below, rest, hydrate well, call or recheck if not much improved within the next 3 to 5 days   Teandra was seen today for sick.  Diagnoses and all orders for this visit:  Flu-like symptoms -     Influenza A/B  Otalgia, unspecified laterality  Acute cough -     POC COVID-19  Acute otitis media, unspecified otitis media type  Sinus pressure  Other orders -     dextromethorphan-guaiFENesin (MUCINEX DM) 30-600 MG 12hr tablet; Take 1 tablet by mouth 2 (two) times daily. -     amoxicillin (AMOXIL) 875 MG tablet; Take 1 tablet  (875 mg total) by mouth 2 (two) times daily for 10 days.  Follow-up as needed

## 2022-10-07 NOTE — Addendum Note (Signed)
Addended by: Carlena Hurl on: 10/07/2022 02:55 PM   Modules accepted: Level of Service

## 2022-11-23 ENCOUNTER — Encounter: Payer: Self-pay | Admitting: Nurse Practitioner

## 2022-11-23 ENCOUNTER — Ambulatory Visit (INDEPENDENT_AMBULATORY_CARE_PROVIDER_SITE_OTHER): Payer: 59 | Admitting: Nurse Practitioner

## 2022-11-23 VITALS — BP 124/72 | HR 58 | Wt 138.8 lb

## 2022-11-23 DIAGNOSIS — J069 Acute upper respiratory infection, unspecified: Secondary | ICD-10-CM

## 2022-11-23 DIAGNOSIS — R590 Localized enlarged lymph nodes: Secondary | ICD-10-CM | POA: Diagnosis not present

## 2022-11-23 DIAGNOSIS — J4520 Mild intermittent asthma, uncomplicated: Secondary | ICD-10-CM | POA: Insufficient documentation

## 2022-11-23 HISTORY — DX: Acute upper respiratory infection, unspecified: J06.9

## 2022-11-23 MED ORDER — ALBUTEROL SULFATE HFA 108 (90 BASE) MCG/ACT IN AERS: 1.0000 | INHALATION_SPRAY | Freq: Four times a day (QID) | RESPIRATORY_TRACT | 1 refills | Status: DC | PRN

## 2022-11-23 MED ORDER — AZITHROMYCIN 250 MG PO TABS: ORAL_TABLET | ORAL | 0 refills | Status: AC

## 2022-11-23 NOTE — Assessment & Plan Note (Signed)
The patient did not complete the prescribed antibiotic course for a previous upper respiratory infection. She has ongoing crackles present bilaterally suggestive of possible infection.  Plan: - Prescribe azithromycin (Z-Pack) - Encourage the patient to complete the full course of antibiotics. - Monitor the patient's symptoms and consider alternative treatments if symptoms do not improve.

## 2022-11-23 NOTE — Progress Notes (Signed)
Tollie Eth, DNP, AGNP-c [facility] [address] [place], [place] [address] [phone]  Subjective:   Allison Baker is a 67 y.o. female presents to day for evaluation of: The patient has not received the shingles vaccine and expresses no interest in receiving it at this time. Plan: No action taken regarding the shingles vaccine.  PMH, Medications, and Allergies reviewed and updated in chart as appropriate.   ROS negative except for what is listed in HPI. Objective:  BP 124/72   Pulse (!) 58   Wt 138 lb 12.8 oz (63 kg)   BMI 25.39 kg/m  Physical Exam Vitals and nursing note reviewed.  Constitutional:      General: She is not in acute distress.    Appearance: Normal appearance. She is not ill-appearing.  HENT:     Head: Normocephalic.  Eyes:     Extraocular Movements: Extraocular movements intact.     Pupils: Pupils are equal, round, and reactive to light.  Neck:     Vascular: No carotid bruit.  Cardiovascular:     Rate and Rhythm: Normal rate and regular rhythm.     Pulses: Normal pulses.     Heart sounds: Normal heart sounds.  Pulmonary:     Effort: Pulmonary effort is normal.     Breath sounds: Wheezing and rhonchi present.  Abdominal:     General: Bowel sounds are normal.     Palpations: Abdomen is soft.  Musculoskeletal:     Cervical back: Normal range of motion. No tenderness.     Right lower leg: No edema.     Left lower leg: No edema.  Lymphadenopathy:     Cervical: No cervical adenopathy.     Upper Body:     Right upper body: No supraclavicular, axillary, pectoral or epitrochlear adenopathy.     Left upper body: No supraclavicular, axillary, pectoral or epitrochlear adenopathy.     Comments: Tenderness present to the left axilla with no masses palpated at this time.   Skin:    General: Skin is warm and dry.     Capillary Refill: Capillary refill takes less than 2 seconds.  Neurological:     General: No focal deficit present.      Mental Status: She is alert and oriented to person, place, and time.  Psychiatric:        Mood and Affect: Mood normal.           Assessment & Plan:   Problem List Items Addressed This Visit     Reactive airway disease, mild intermittent, uncomplicated - Primary    The patient reports discomfort in the lungs, which may be triggered by cold air, hot air, or allergies. Lung auscultation revealed crackles and wheezing, but no pleural rub.  Plan: - Prescribe albuterol inhaler as needed to relieve airway spasms and discomfort. - The patient can use the inhaler every 4-6 hours as needed or before bed every night. - Monitor the patient's symptoms and consider further evaluation, such as a chest x-ray, if symptoms do not improve.      Relevant Medications   albuterol (VENTOLIN HFA) 108 (90 Base) MCG/ACT inhaler   Upper respiratory tract infection    The patient did not complete the prescribed antibiotic course for a previous upper respiratory infection. She has ongoing crackles present bilaterally suggestive of possible infection.  Plan: - Prescribe azithromycin (Z-Pack) - Encourage the patient to complete the full course of antibiotics. - Monitor the patient's symptoms and consider alternative treatments  if symptoms do not improve.      Relevant Medications   azithromycin (ZITHROMAX) 250 MG tablet   Axillary lymphadenopathy    The patient reports swelling and discomfort in the armpit following the COVID vaccine. The swelling has decreased, but discomfort persists. This is likely due to an immune reaction causing lymph node enlargement. Plan: - Reassure the patient that this is a common reaction and should resolve within six months. - Advise the patient to wait at least six months before having a mammogram. - If the swelling and discomfort do not resolve within the next two months (at the 6 month mark), the patient should let me know and we will plan ultrasound.          Tollie Eth, DNP, AGNP-c 11/23/2022  7:07 PM    History, Medications, Surgery, SDOH, and Family History reviewed and updated as appropriate.

## 2022-11-23 NOTE — Patient Instructions (Addendum)
The discomfort under your arm is likely related to the lymph node swelling from the COVID vaccine. This can cause the swelling and then ongoing tenderness for several months. If this continues, we can get an ultrasound of the area, but I want to make sure this doesn't go away on it's own.   I think the discomfort in your back is from reactive airway, which is like asthma, but happens when there is a trigger like cold air, allergies, a cold, etc. I have sent in a prescription for an inhaler to use as you need it when this happens.   I have sent in Azithromycin for you. This will help to cover the rest of the antibiotic that wasn't finished.   If you still have the pain in your back after using the inhaler, please let me know.   If you still have the pain in the underarm in 2 months, let me know. We can always order imaging.

## 2022-11-23 NOTE — Assessment & Plan Note (Signed)
The patient reports discomfort in the lungs, which may be triggered by cold air, hot air, or allergies. Lung auscultation revealed crackles and wheezing, but no pleural rub.  Plan: - Prescribe albuterol inhaler as needed to relieve airway spasms and discomfort. - The patient can use the inhaler every 4-6 hours as needed or before bed every night. - Monitor the patient's symptoms and consider further evaluation, such as a chest x-ray, if symptoms do not improve.

## 2022-11-23 NOTE — Assessment & Plan Note (Signed)
The patient reports swelling and discomfort in the armpit following the COVID vaccine. The swelling has decreased, but discomfort persists. This is likely due to an immune reaction causing lymph node enlargement. Plan: - Reassure the patient that this is a common reaction and should resolve within six months. - Advise the patient to wait at least six months before having a mammogram. - If the swelling and discomfort do not resolve within the next two months (at the 6 month mark), the patient should let me know and we will plan ultrasound.

## 2022-12-14 ENCOUNTER — Ambulatory Visit (INDEPENDENT_AMBULATORY_CARE_PROVIDER_SITE_OTHER): Payer: 59 | Admitting: Nurse Practitioner

## 2022-12-14 ENCOUNTER — Encounter: Payer: Self-pay | Admitting: Nurse Practitioner

## 2022-12-14 VITALS — BP 136/80 | HR 63 | Ht 62.5 in | Wt 139.4 lb

## 2022-12-14 DIAGNOSIS — E559 Vitamin D deficiency, unspecified: Secondary | ICD-10-CM

## 2022-12-14 DIAGNOSIS — Z0000 Encounter for general adult medical examination without abnormal findings: Secondary | ICD-10-CM | POA: Insufficient documentation

## 2022-12-14 DIAGNOSIS — E785 Hyperlipidemia, unspecified: Secondary | ICD-10-CM

## 2022-12-14 DIAGNOSIS — Z1231 Encounter for screening mammogram for malignant neoplasm of breast: Secondary | ICD-10-CM | POA: Diagnosis not present

## 2022-12-14 DIAGNOSIS — Z13820 Encounter for screening for osteoporosis: Secondary | ICD-10-CM

## 2022-12-14 LAB — CBC WITH DIFFERENTIAL/PLATELET
Eos: 3 %
MCHC: 33.6 g/dL (ref 31.5–35.7)
MCV: 89 fL (ref 79–97)
Monocytes: 10 %
Neutrophils Absolute: 2.6 10*3/uL (ref 1.4–7.0)
Platelets: 308 10*3/uL (ref 150–450)

## 2022-12-14 LAB — COMPREHENSIVE METABOLIC PANEL
Albumin: 4.4 g/dL (ref 3.9–4.9)
Bilirubin Total: 0.5 mg/dL (ref 0.0–1.2)
Creatinine, Ser: 0.67 mg/dL (ref 0.57–1.00)

## 2022-12-14 LAB — LIPID PANEL
LDL Chol Calc (NIH): 125 mg/dL — ABNORMAL HIGH (ref 0–99)
Triglycerides: 85 mg/dL (ref 0–149)

## 2022-12-14 NOTE — Patient Instructions (Addendum)
I have sent the order for mammogram and asked them to also do a bone density exam at the same time. They will call you to schedule this.   I will let you know if we have any concerns with your labs.   Your exam was perfect today!! Everything looks very good.   I put some information on lupus for you to look over for your sister.

## 2022-12-14 NOTE — Progress Notes (Signed)
Primary Care & Sports Medicine [facility] at [facility] [address]  [address] [place], [place]  [address] [phone]   MEDICARE [place] VISIT  12/30/2022  Subjective:  Allison Baker is a 67 y.o. female patient of Allison Baker, Sung Amabile, NP who had a Medicare Annual Wellness Visit today. Allison Baker is Working full time and lives with their family. she lives with her daughter. she reports that she is socially active and does interact with friends/family regularly. she is moderately physically active and enjoys spending time with her granddaughter.  Patient Care Team: Anisha Starliper, Sung Amabile, NP as PCP - General (Nurse Practitioner)     12/14/2022   10:25 AM 12/27/2021    2:05 PM 08/22/2013   12:28 PM  Advanced Directives  Does Patient Have a Medical Advance Directive? No No Patient does not have advance directive  Would patient like information on creating a medical advance directive? No - Patient declined No - Patient declined   Pre-existing out of facility DNR order (yellow form or pink MOST form)   No    Hospital Utilization Over the Past 12 Months: # of hospitalizations or ER visits: 1 # of surgeries: 0  Review of Systems    Patient reports that her overall health is unchanged when compared to last year.  Review of Systems: ROS negative for all systems  All other systems negative.  Pain Assessment  0/10     Current Medications & Allergies (verified) Allergies as of 12/14/2022   No Known Allergies      Medication List        Accurate as of Dec 14, 2022 11:59 PM. If you have any questions, ask your nurse or doctor.          STOP taking these medications    albuterol 108 (90 Base) MCG/ACT inhaler Commonly known as: Ventolin HFA Stopped by: Tollie Eth, NP       TAKE these medications    multivitamin tablet Take 1 tablet by mouth daily.        History (reviewed): Past Medical History:  Diagnosis Date   Dyslipidemia     Palpitations    Past Surgical History:  Procedure Laterality Date   FRACTURE SURGERY  08/2013   HAND   ORIF WRIST FRACTURE Right 08/24/2013   Procedure: OPEN REDUCTION INTERNAL FIXATION (ORIF) RIGHT WRIST FRACTURE;  Surgeon: Sharma Covert, MD;  Location: [facility] OR;  Service: Orthopedics;  Laterality: Right;   TUBAL LIGATION     Family History  Problem Relation Age of Onset   Cancer Father        leukemia   Cancer Sister        ovarian   Cancer Brother        brain   Colon cancer Neg Hx    Esophageal cancer Neg Hx    Rectal cancer Neg Hx    Stomach cancer Neg Hx    Social History   Socioeconomic History   Marital status: Single    Spouse name: Not on file   Number of children: Not on file   Years of education: Not on file   Highest education level: Not on file  Occupational History   Not on file  Tobacco Use   Smoking status: Some Days    Packs/day: 0    Types: Cigarettes   Smokeless tobacco: Never   Tobacco comments:    maybe 1-2 per day  Vaping Use   Vaping Use: Never used  Substance and Sexual Activity   Alcohol use: No   Drug use: No   Sexual activity: Not Currently  Other Topics Concern   Not on file  Social History Narrative   Lives daughter.     Social Determinants of Health   Financial Resource Strain: Low Risk  (12/14/2022)   Overall Financial Resource Strain (CARDIA)    Difficulty of Paying Living Expenses: Not hard at all  Food Insecurity: No Food Insecurity (12/14/2022)   Hunger Vital Sign    Worried About Running Out of Food in the Last Year: Never true    Ran Out of Food in the Last Year: Never true  Transportation Needs: No Transportation Needs (12/14/2022)   PRAPARE - Administrator, Civil Service (Medical): No    Lack of Transportation (Non-Medical): No  Physical Activity: Sufficiently Active (12/14/2022)   Exercise Vital Sign    Days of Exercise per Week: 5 days    Minutes of Exercise per Session: 30 min  Stress: No Stress Concern  Present (12/14/2022)   [employer] of Occupational Health - Occupational Stress Questionnaire    Feeling of Stress : Only a little  Social Connections: Unknown (12/14/2022)   Social Connection and Isolation Panel [NHANES]    Frequency of Communication with Friends and Family: More than three times a week    Frequency of Social Gatherings with Friends and Family: More than three times a week    Attends Religious Services: More than 4 times per year    Active Member of [employer] or Organizations: Yes    Attends Banker Meetings: More than 4 times per year    Marital Status: Patient declined    Activities of Daily Living    12/14/2022   10:20 AM  In your present state of health, do you have any difficulty performing the following activities:  Hearing? 0  Vision? 0  Difficulty concentrating or making decisions? 0  Walking or climbing stairs? 0  Dressing or bathing? 0  Doing errands, shopping? 0  Preparing Food and eating ? N  Using the Toilet? N  In the past six months, have you accidently leaked urine? N  Do you have problems with loss of bowel control? N  Managing your Medications? N  Managing your Finances? N  Housekeeping or managing your Housekeeping? N    Patient Education/Literacy    Exercise Current Exercise Habits: The patient does not participate in regular exercise at present  Diet Patient reports consuming 2 meals a day and 1 snack(s) a day Patient reports that her primary diet is: Regular Patient reports that she does have regular access to food.   Depression Screen    12/14/2022   10:15 AM 12/30/2021   10:04 AM 12/16/2021   10:45 AM 04/29/2021    3:32 AM 01/14/2021   11:15 AM 04/04/2019    9:19 AM 11/09/2017    3:44 PM  PHQ 2/9 Scores  PHQ - 2 Score 0 0 0 0 0 0 0  PHQ- 9 Score      0   Exception Documentation  Medical reason Medical reason         Fall Risk    12/14/2022   10:15 AM 09/01/2022    2:05 PM 12/30/2021   10:03 AM 12/16/2021    10:45 AM 01/14/2021   11:15 AM  Fall Risk   Falls in the past year? 0 0 0 0 0  Number falls in past yr: 0 0 0  0 0  Injury with Fall? 0 0 0 0 0  Risk for fall due to : No Fall Risks No Fall Risks No Fall Risks;Other (Comment) No Fall Risks No Fall Risks  Follow up Falls evaluation completed Falls evaluation completed Falls evaluation completed;Education provided Falls evaluation completed;Education provided Falls evaluation completed     Objective:   BP 136/80   Pulse 63   Ht 5' 2.5" (1.588 m)   Wt 139 lb 6.4 oz (63.2 kg)   BMI 25.09 kg/m   Last Weight  Most recent update: 12/14/2022 10:25 AM    Weight  63.2 kg (139 lb 6.4 oz)             Body mass index is 25.09 kg/m.  Hearing/Vision  Jovee did not have difficulty with hearing/understanding during the face-to-face interview Kashawn did not have difficulty with her vision during the face-to-face interview Reports that she has had a formal eye exam by an eye care professional within the past year Reports that she has not had a formal hearing evaluation within the past year  Cognitive Function:    12/30/2022    6:30 PM  6CIT Screen  What Year? 0 points  What month? 0 points  What time? 0 points  Count back from 20 0 points  Months in reverse 0 points  Repeat phrase 0 points  Total Score 0 points    Normal Cognitive Function Screening: Yes (Normal:0-7, Significant for Dysfunction: >8)  Immunization & Health Maintenance Record Immunization History  Administered Date(s) Administered   COVID-19, mRNA, vaccine(Comirnaty)12 years and older 09/01/2022   Fluad Quad(high Dose 65+) 07/17/2020, 04/28/2021, 06/09/2022   Influenza Inj Mdck Quad Pf 08/30/2018   Influenza,inj,Quad PF,6+ Mos 04/04/2019   PFIZER(Purple Top)SARS-COV-2 Vaccination 10/19/2019, 11/09/2019   Pneumococcal Conjugate-13 04/14/2021   Tdap 10/31/2015, 11/30/2020    Health Maintenance  Topic Date Due   MAMMOGRAM  Never done   DEXA SCAN  Never done    COVID-19 Vaccine (4 - 2023-24 season) 12/30/2022 (Originally 10/27/2022)   Pneumonia Vaccine 66+ Years old (2 of 2 - PPSV23 or PCV20) 12/14/2023 (Originally 06/09/2021)   INFLUENZA VACCINE  03/04/2023   Colonoscopy  09/02/2030   DTaP/Tdap/Td (3 - Td or Tdap) 12/01/2030   Hepatitis C Screening  Completed   HPV VACCINES  Aged Out   Zoster Vaccines- Shingrix  Discontinued       Assessment  This is a routine wellness examination for [employer].  Health Maintenance: Due or Overdue Health Maintenance Due  Topic Date Due   MAMMOGRAM  Never done   DEXA SCAN  Never done    Lewie Loron does not need a referral for Community Assistance: Care Management:   not applicable Social Work:    not applicable Prescription Assistance:  not applicable Nutrition/Diabetes Education:  not applicable   Plan:  Personalized Goals  Goals Addressed             This Visit's Progress    Patient Stated       Go to the gym again.        Personalized Health Maintenance & Screening Recommendations  Screening mammography Bone densitometry screening  Lung Cancer Screening Recommended: not applicable (Low Dose CT Chest recommended if Age 69-80 years, 30 pack-year currently smoking OR have quit w/in past 15 years) Hepatitis C Screening recommended: not applicable HIV Screening recommended: not applicable  Advanced Directives: Written information was not given per the patient's request.  Referrals & Orders Orders Placed This  Encounter  Procedures   MM 3D SCREENING MAMMOGRAM BILATERAL BREAST   DG Bone Density   CBC with Differential/Platelet   Comprehensive metabolic panel   Lipid panel   VITAMIN D 25 Hydroxy (Vit-D Deficiency, Fractures)    Follow-up Plan Follow-up with Lakiesha Ralphs, Sung Amabile, NP as planned    I have personally reviewed and noted the following in the patient's chart:   Medical and social history Use of alcohol, tobacco or illicit drugs  Current medications and  supplements Functional ability and status Nutritional status Physical activity Advanced directives List of other physicians Hospitalizations, surgeries, and ER visits in previous 12 months Vitals Screenings to include cognitive, depression, and falls Referrals and appointments  In addition, I have reviewed and discussed with patient certain preventive protocols, quality metrics, and best practice recommendations. A written personalized care plan for preventive services as well as general preventive health recommendations were provided to patient.     Tollie Eth, DNP, AGNP-c   12/30/2022

## 2022-12-15 LAB — COMPREHENSIVE METABOLIC PANEL
ALT: 18 IU/L (ref 0–32)
AST: 20 IU/L (ref 0–40)
Albumin/Globulin Ratio: 1.8 (ref 1.2–2.2)
Alkaline Phosphatase: 115 IU/L (ref 44–121)
BUN/Creatinine Ratio: 16 (ref 12–28)
BUN: 11 mg/dL (ref 8–27)
CO2: 23 mmol/L (ref 20–29)
Calcium: 9.5 mg/dL (ref 8.7–10.3)
Chloride: 106 mmol/L (ref 96–106)
Globulin, Total: 2.5 g/dL (ref 1.5–4.5)
Glucose: 82 mg/dL (ref 70–99)
Potassium: 4.8 mmol/L (ref 3.5–5.2)
Sodium: 141 mmol/L (ref 134–144)
Total Protein: 6.9 g/dL (ref 6.0–8.5)
eGFR: 95 mL/min/{1.73_m2} (ref >59)

## 2022-12-15 LAB — CBC WITH DIFFERENTIAL/PLATELET
Basophils Absolute: 0 10*3/uL (ref 0.0–0.2)
Basos: 1 %
EOS (ABSOLUTE): 0.2 10*3/uL (ref 0.0–0.4)
Hematocrit: 40.5 % (ref 34.0–46.6)
Hemoglobin: 13.6 g/dL (ref 11.1–15.9)
Immature Grans (Abs): 0 10*3/uL (ref 0.0–0.1)
Immature Granulocytes: 0 %
Lymphocytes Absolute: 2.5 10*3/uL (ref 0.7–3.1)
Lymphs: 43 %
MCH: 30 pg (ref 26.6–33.0)
Monocytes Absolute: 0.6 10*3/uL (ref 0.1–0.9)
Neutrophils: 43 %
RBC: 4.54 x10E6/uL (ref 3.77–5.28)
RDW: 12.5 % (ref 11.7–15.4)
WBC: 5.9 10*3/uL (ref 3.4–10.8)

## 2022-12-15 LAB — LIPID PANEL
Chol/HDL Ratio: 3 ratio (ref 0.0–4.4)
Cholesterol, Total: 209 mg/dL — ABNORMAL HIGH (ref 100–199)
HDL: 69 mg/dL (ref >39)
VLDL Cholesterol Cal: 15 mg/dL (ref 5–40)

## 2022-12-15 LAB — VITAMIN D 25 HYDROXY (VIT D DEFICIENCY, FRACTURES): Vit D, 25-Hydroxy: 27.2 ng/mL — ABNORMAL LOW (ref 30.0–100.0)

## 2022-12-17 ENCOUNTER — Other Ambulatory Visit: Payer: Self-pay

## 2022-12-17 MED ORDER — VITAMIN D (ERGOCALCIFEROL) 1.25 MG (50000 UNIT) PO CAPS: 50000.0000 [IU] | ORAL_CAPSULE | ORAL | 1 refills | Status: DC

## 2022-12-30 ENCOUNTER — Encounter: Payer: Self-pay | Admitting: Nurse Practitioner

## 2023-01-05 ENCOUNTER — Ambulatory Visit
Admission: RE | Admit: 2023-01-05 | Discharge: 2023-01-05 | Disposition: A | Discharge status: Home / Self Care | Payer: 59 | Source: Ambulatory Visit | Attending: Nurse Practitioner | Admitting: Nurse Practitioner

## 2023-01-05 DIAGNOSIS — Z13820 Encounter for screening for osteoporosis: Secondary | ICD-10-CM

## 2023-01-05 DIAGNOSIS — Z1231 Encounter for screening mammogram for malignant neoplasm of breast: Secondary | ICD-10-CM

## 2023-01-05 DIAGNOSIS — E785 Hyperlipidemia, unspecified: Secondary | ICD-10-CM

## 2023-01-05 DIAGNOSIS — E559 Vitamin D deficiency, unspecified: Secondary | ICD-10-CM

## 2023-01-05 DIAGNOSIS — Z0000 Encounter for general adult medical examination without abnormal findings: Secondary | ICD-10-CM

## 2023-01-07 ENCOUNTER — Other Ambulatory Visit: Payer: Self-pay | Admitting: Nurse Practitioner

## 2023-01-07 DIAGNOSIS — R928 Other abnormal and inconclusive findings on diagnostic imaging of breast: Secondary | ICD-10-CM

## 2023-01-19 ENCOUNTER — Ambulatory Visit
Admission: RE | Admit: 2023-01-19 | Discharge: 2023-01-19 | Disposition: A | Discharge status: Home / Self Care | Payer: 59 | Source: Ambulatory Visit | Attending: Nurse Practitioner | Admitting: Nurse Practitioner

## 2023-01-19 DIAGNOSIS — R928 Other abnormal and inconclusive findings on diagnostic imaging of breast: Secondary | ICD-10-CM

## 2023-03-08 ENCOUNTER — Ambulatory Visit: Payer: 59 | Admitting: Nurse Practitioner

## 2023-03-22 ENCOUNTER — Ambulatory Visit (INDEPENDENT_AMBULATORY_CARE_PROVIDER_SITE_OTHER): Payer: 59 | Admitting: Nurse Practitioner

## 2023-03-22 ENCOUNTER — Encounter: Payer: Self-pay | Admitting: Nurse Practitioner

## 2023-03-22 VITALS — BP 124/80 | HR 72 | Wt 139.0 lb

## 2023-03-22 DIAGNOSIS — S29019S Strain of muscle and tendon of unspecified wall of thorax, sequela: Secondary | ICD-10-CM

## 2023-03-22 DIAGNOSIS — B369 Superficial mycosis, unspecified: Secondary | ICD-10-CM

## 2023-03-22 DIAGNOSIS — S29019A Strain of muscle and tendon of unspecified wall of thorax, initial encounter: Secondary | ICD-10-CM | POA: Diagnosis not present

## 2023-03-22 HISTORY — DX: Superficial mycosis, unspecified: B36.9

## 2023-03-22 MED ORDER — CYCLOBENZAPRINE HCL 5 MG PO TABS
5.0000 mg | ORAL_TABLET | Freq: Three times a day (TID) | ORAL | 1 refills | Status: DC | PRN
Start: 2023-03-22 — End: 2023-08-03

## 2023-03-22 MED ORDER — KETOCONAZOLE 2 % EX CREA
1.0000 | TOPICAL_CREAM | Freq: Every day | CUTANEOUS | 0 refills | Status: AC
Start: 2023-03-22 — End: ?

## 2023-03-22 NOTE — Patient Instructions (Signed)
Distensin torcica Thoracic Strain La distensin torcica es una lesin en los msculos o los tendones que se fijan a la parte superior de la espalda. Los tendones son tejidos que Lawyer msculo al [employer]. Esta lesin a veces se conoce como distensin de la parte media de la espalda. Las distensiones pueden ser leves o muy graves. Una distensin leve puede curarse tras 1 o 2 semanas solamente. Una distensin muy grave implica el desgarro de los msculos o tendones, por lo que puede demorar entre 6 y 8 semanas en curarse. Cules son las causas? Esta afeccin puede ser causada por lo siguiente: Neomia Dear cada o un golpe en el cuerpo. Torsin o estiramiento excesivo de la espalda. Esto puede suceder al realizar actividades que requieren mucha energa, como levantar objetos pesados. En algunos casos, es posible que la causa se desconozca. Qu incrementa el riesgo? Esta lesin es ms frecuente en las siguientes personas: Atletas. Personas que tienen sobrepeso (obesidad). Cules son los signos o sntomas? Dolor en la parte media de la espalda, especialmente al hacer movimientos. Este es el sntoma principal. Valentino Saxon o amplitud de movimientos limitada. Contracciones musculares sbitas (espasmos). Cmo se trata? El tratamiento de esta afeccin puede incluir: Mantener la zona de la lesin en reposo. Aplicar calor y fro en la zona lesionada. Analgsicos y antiinflamatorios, como antiinflamatorios no esteroideos (AINE). Medicamentos recetados para Chief Technology Officer o para Armed forces logistics/support/administrative officer. Estos pueden usarse durante un tiempo breve de ser necesario. Fisioterapia. Se trata de hacer ejercicios para ayudarlo a moverse mejor y fortalecerse. Tratamientos realizados en la zona dolorida. Puede ser: Estimulacin elctrica. Activar el msculo con agujas pequeas (activacin con agujas de acupuntura). Inyecciones de medicamentos (inyecciones de puntos neurlgicos). Siga estas instrucciones en su  casa: Control del dolor, la rigidez y la hinchazn     Aplique hielo sobre la zona lesionada si se lo indican. Ponga el hielo en una bolsa plstica. Coloque una toalla entre la piel y Copy. Aplique el hielo durante 20 minutos, 2 a 3 veces por da. Si se lo indican, aplique calor en la zona afectada. Hgalo con la frecuencia que le haya indicado el mdico. Use la fuente de calor que el mdico le recomiende, como una compresa de calor hmedo o una almohadilla trmica. Coloque una toalla entre la piel y la fuente de Airline pilot. Aplique calor durante 20 a 30 minutos. Si la piel se le pone de color rojo brillante, quite el hielo o el calor de inmediato para evitar daos en la piel. El Cedar de dao es mayor si no puede sentir dolor, Airline pilot o fro. Actividad Haga reposo segn las indicaciones que le hayan dado. Retome sus actividades normales cuando el mdico le diga que es seguro. Haga los ejercicios como se lo haya indicado el mdico. Medicamentos Use los medicamentos de venta libre y los recetados solamente como se lo haya indicado el mdico. Pregunte al mdico si debe evitar conducir o Chemical engineer mquinas mientras toma los medicamentos. Si se lo indican, tome medidas a fin de prevenir problemas para ir de cuerpo (estreimiento). Es posible que deba hacer lo siguiente: Product manager suficiente lquido para Radio producer pis (orina) de color amarillo plido. Tomar medicamentos. Le dirn qu medicamentos debe tomar. Comer alimentos ricos en fibra. Entre ellos, frijoles, cereales integrales y frutas y verduras frescas. Limitar los alimentos con alto contenido de grasa y International aid/development worker. Estos incluyen alimentos fritos o dulces. Instrucciones generales No fume ni consuma ningn producto que contenga nicotina o tabaco. Si necesita ayuda para  dejar de fumar, consulte al mdico. Concurra a todas las visitas de seguimiento. El mdico controlar la lesin y Dana. Cmo se previene? Para prevenir una lesin futura en  la parte media de la espalda: Siempre haga un precalentamiento antes de la actividad fsica o de practicar deportes. Reljese y elongue despus de hacer actividad fsica. Practique deportes y levante objetos pesados de forma correcta. Flexione las rodillas antes de levantar objetos pesados. Adopte una buena postura mientras est sentado y de pie. Mantngase en buen estado fsico y con un peso saludable. Haga por lo menos 150 minutos de ejercicios de intensidad moderada cada semana, como caminar a Retail banker o hacer gimnasia acutica. Haga ejercicios de fuerza al menos 2 veces por semana. Comunquese con un mdico si: El dolor no se alivia con los [employer] Par[employer]. El dolor o la rigidez [place]. Siente dolor o rigidez en el cuello o en la parte baja de la espalda. Solicite ayuda de inmediato si: Le falta el aire. Siente dolor en el pecho. Tiene debilidad, hormigueo o prdida de la sensibilidad (adormecimiento)en las piernas. No puede controlar la miccin. Estos sntomas pueden Customer service manager. Solicite ayuda de inmediato. Llame al 911. No espere a ver si los sntomas desaparecen. No conduzca por sus propios medios [employer]. Esta informacin no tiene Theme park manager el consejo del mdico. Asegrese de hacerle al mdico cualquier pregunta que tenga. Document Revised: 04/08/2022 Document Reviewed: 04/08/2022 Elsevier Patient Education  2024 Elsevier Inc.   Rehabilitacin de la distensin Retail buyer Strain Rehab Pregunte al mdico qu ejercicios son seguros para usted. Haga los ejercicios exactamente como se lo haya dicho el mdico y gradelos como se lo haya indicado. Es normal sentir un leve estiramiento, tironeo, opresin o Dentist al [employer] Inc ejercicios. Detngase de inmediato si siente un dolor repentino o Community education officer. No comience a hacer estos ejercicios hasta que se lo indique el mdico. Ejercicio de elongacin y amplitud de movimiento Este ejercicio  calienta los msculos y las articulaciones, y mejora la movilidad y la flexibilidad de la espalda y los hombros. Este ejercicio tambin ayuda a Engineer, materials. Estiramiento de pecho y columna  Acustese boca arriba sobre una superficie firme. Use una toalla o una manta pequea para armar un rollo de 4 pulgadas (10 cm) de dimetro. Coloque la toalla debajo de la parte media de la espalda de modo que quede debajo de la columna, pero no debajo de los omplatos. Coloque las manos detrs de la cabeza y deje caer los codos Leslie lados. Esto aumentar el estiramiento. Inspire profundamente (inhale). Mantenga esta posicin durante __________ segundos. Reljese despus de espirar (exhalar). Repita __________ veces. Realice este ejercicio __________ veces al da. Ejercicios de fortalecimiento Estos ejercicios fortalecen los msculos de la espalda y los omplatos, y les otorgan resistencia. La resistencia es la capacidad de usar los msculos durante un tiempo prolongado, incluso despus de que se cansen. Elevaciones alternadas de pierna y brazo  Apoye las palmas de las manos y las rodillas sobre una superficie firme. Si est sobre un suelo duro, puede usar un elemento acolchado, como una alfombrilla para ejercicios, para apoyar las rodillas. Alinee los brazos y las piernas. Las manos deben estar justo debajo de los hombros, y las rodillas debajo de la cadera. Eleve la pierna [employer]. Al mismo tiempo, eleve el brazo derecho y Associate Professor frente a usted. No eleve la pierna por encima de la cadera. No eleve el brazo por encima del hombro. Mantenga  los msculos del abdomen y de la espalda contrados. Mantenga la cadera mirando hacia el suelo. No arquee la espalda. Mantenga el equilibrio con cuidado. No contenga la respiracin. Mantenga esta posicin durante __________ segundos. Lentamente regrese a la posicin inicial y repita el ejercicio con la pierna derecha y el brazo  izquierdo. Repita __________ veces. Realice este ejercicio __________ veces al da. Remo con los brazos extendidos Este ejercicio tambin se denomina ejercicio de extensin de hombros. Prese con los pies separados a la distancia de los hombros. Ate una banda para ejercicios a un objeto estable que est frente a usted, de modo que la banda est por encima de la altura del hombro o en el mismo nivel. Sostenga un extremo de la banda para ejercicios en cada mano. Extienda los codos y [employer] a la altura de los hombros. Camine hacia atrs, alejndose del extremo fijo de la banda para ejercicios hasta que esta se tense. Junte los omplatos y NIKE costados de los muslos. Detngase cuando las manos estn en la misma posicin en ambos costados. Esto es la extensin de los hombros. No deje que las manos vayan hacia atrs del cuerpo. Mantenga esta posicin durante __________ segundos. Vuelva lentamente a la posicin inicial. Repita __________ veces. Realice este ejercicio __________ veces al da. Remo con rotacin escapular En este ejercicio, los omplatos (escpulas) se acercan entre s (retraccin). Sintese en una silla estable que no tenga apoyabrazos o pngase de pie. Ate una banda para ejercicios a un objeto estable que est frente a usted, de modo que la banda est a la altura del hombro. Sostenga un extremo de la banda para ejercicios en cada mano. Las palmas deben estar enfrentadas. Lleve los brazos extendidos adelante. Camine hacia atrs, alejndose del extremo fijo de la banda para ejercicios hasta que esta se tense. Tire la [employer] Corporation. Mientras hace esto, flexione los codos y Apple Computer, pero evite mover el resto del cuerpo. No encoja los hombros hacia arriba al hacerlo. Detngase cuando los codos estn a los lados o ligeramente detrs del cuerpo. Mantenga esta posicin durante __________ segundos. Extienda lentamente los brazos para regresar  a la posicin inicial. Repita __________ [employer]. Realice este ejercicio __________ veces al da. Postura y [place] corporal La buena postura y la Administrator, sports corporal saludable pueden ayudar a Acupuncturist estrs en las articulaciones y los tejidos del cuerpo. La Water quality scientist se refiere a los movimientos y a las posiciones del cuerpo mientras realiza las actividades diarias. La postura es una parte de la Water quality scientist. Neomia Dear buena postura significa que: La columna est en su posicin natural de curvatura en S (neutral). Los hombros estn [employer]. La cabeza no est inclinada hacia adelante. Siga esas pautas para mejorar la postura y Regulatory affairs officer en sus actividades diarias. De pie  Al estar de pie, mantenga la columna en la posicin neutral y los pies separados al ancho de caderas, aproximadamente. Mantenga las rodillas ligeramente flexionadas. Las [place], los hombros y la cadera deben estar alineados. Cuando realice una tarea en la que deba inclinarse hacia adelante y estar de pie en el mismo sitio durante mucho tiempo, coloque un pie en un objeto estable de 2 a 4 pulgadas (5 a 10 cm) de alto, como un taburete. Esto ayuda a que la columna mantenga una posicin neutral. Sentado  Cuando est sentado, mantenga la columna en posicin neutral y deje los pies apoyados en el suelo. Use un apoyapis  si es necesario. Mantenga los muslos paralelos al suelo. Evite redondear los hombros e inclinar la cabeza hacia adelante. Cuando trabaje en un escritorio o con una computadora, el escritorio debe estar a una altura en la que las manos estn un poco ms abajo que los codos. Deslice la silla debajo del escritorio, de modo de estar lo suficientemente cerca como para mantener una buena [place]. Cuando trabaje con una computadora, coloque el monitor a una altura que le permita mirar derecho hacia adelante, sin tener que inclinar la cabeza hacia adelante o [place] atrs. Reposo Al descansar o  estar acostado, evite las posiciones que le causen ms dolor. Si siente dolor al hacer actividades que exigen sentarse, inclinarse, agacharse o ponerse en cuclillas (actividadesbasadas en la flexin), acustese en una posicin en la que el cuerpo no deba doblarse mucho. Por ejemplo, evite acurrucarse de costado con los brazos y las rodillas cerca del pecho (posicin fetal). Si siente dolor con las actividades que exigen estar de pie durante mucho tiempo o Furniture conservator/restorer los brazos [phone] en la extensin), acustese con la columna en una posicin neutral y flexione ligeramente las rodillas. Pruebe con las siguientes posiciones: Acustese de costado con una almohada entre las rodillas. Acustese boca arriba con una almohada debajo de las rodillas.  Levantar objetos  Cuando tenga que levantar un objeto, mantenga los pies separados el ancho de los hombros, como Octavia, y apriete los msculos abdominales. Flexione las rodillas y la cadera, y Dietitian la columna en posicin neutral. Es importante levantarse utilizando la fuerza de las piernas, no la fuerza de la espalda. No trabe las rodillas hacia afuera. Siempre pida ayuda a otra persona para levantar objetos pesados o incmodos. Esta informacin no tiene Theme park manager el consejo del mdico. Asegrese de hacerle al mdico cualquier pregunta que tenga. Document Revised: 04/08/2022 Document Reviewed: 04/08/2022 Elsevier Patient Education  2024 [employer].

## 2023-03-22 NOTE — Progress Notes (Signed)
Tollie Eth, DNP, AGNP-c [facility] [address] [place], [place] [address] [phone]   ACUTE VISIT- ESTABLISHED PATIENT  Blood pressure 124/80, pulse 72, weight 139 lb (63 kg).  Subjective:  HPI Allison Baker is a 67 y.o. female presents to day for evaluation of acute concern(s).   Emylia reports on and of back pain that tends to be located in the thoracic region with some radiation up into the shoulders at times. She does a lot of pulling and some lifting at work, but nothing exceptionally heavy. She reports the pain is not every day, but when it is present it can be very uncomfortable. In the past she has tried muscle relaxer and NSAIDs which did help some with her symptoms. She would like to know if there is anything can could help. She asks about massage.   She also mentions some discoloration on her arms that has been present for a long time. The discoloration is limited to her forearms with no itching present. She has been using some natural cream on this and she does feel it is helping. She has not had an evaluation with dermatology and she would like to establish with someone.   ROS negative except for what is listed in HPI. History, Medications, Surgery, SDOH, and Family History reviewed and updated as appropriate.  Objective:  Physical Exam Vitals and nursing note reviewed.  Constitutional:      Appearance: Normal appearance.  Cardiovascular:     Rate and Rhythm: Normal rate.     Pulses: Normal pulses.     Heart sounds: Normal heart sounds.  Pulmonary:     Effort: Pulmonary effort is normal.     Breath sounds: Normal breath sounds.  Musculoskeletal:        General: Tenderness present.       Arms:     Right lower leg: No edema.     Left lower leg: No edema.  Skin:    General: Skin is warm and dry.     Capillary Refill: Capillary refill takes less than 2 seconds.     Findings: Rash present. Rash is macular.     Comments: Macular rash slightly  darker in color than normal skin tone with irregular borders noted on the forearms bilaterally. No other areas noted.   Neurological:     General: No focal deficit present.     Mental Status: She is alert.     Motor: No weakness.     Gait: Gait normal.  Psychiatric:        Behavior: Behavior normal.         Assessment & Plan:   Problem List Items Addressed This Visit     Thoracic myofascial strain - Primary    Symptoms consistent with strain of the thoracic right sided back musculature with spasms present up into the trapezius on the right side. Muscles are very tense on palpation. No ecchymosis or erythema. I have recommended gentle stretching exercises and use of TENS unit to help with pain and reduction of symptoms. Will also send in flexeril for spasms at bedtime and monitor. Formal PT may be needed if this is not effective.       Relevant Medications   cyclobenzaprine (FLEXERIL) 5 MG tablet   Superficial fungal infection of skin    Discoloration of the arms noted with appearance similar to fungal etiology. She has not had a skin evaluation by dermatology. I recommend we start ketoconazole on the skin for treatment and  get her in with dermatology for further evaluation and skin assessment. Will send referral.       Relevant Medications   ketoconazole (NIZORAL) 2 % cream   Other Relevant Orders   Ambulatory referral to Dermatology      Time: 32  minutes, >50% spent counseling, care coordination, chart review, and documentation.   Tollie Eth, DNP, AGNP-c

## 2023-03-22 NOTE — Assessment & Plan Note (Addendum)
Symptoms consistent with strain of the thoracic right sided back musculature with spasms present up into the trapezius on the right side. Muscles are very tense on palpation. No ecchymosis or erythema. I have recommended gentle stretching exercises and use of TENS unit to help with pain and reduction of symptoms. Will also send in flexeril for spasms at bedtime and monitor. Formal PT may be needed if this is not effective.

## 2023-03-29 NOTE — Assessment & Plan Note (Signed)
Discoloration of the arms noted with appearance similar to fungal etiology. She has not had a skin evaluation by dermatology. I recommend we start ketoconazole on the skin for treatment and get her in with dermatology for further evaluation and skin assessment. Will send referral.

## 2023-06-21 ENCOUNTER — Ambulatory Visit: Payer: 59 | Admitting: Nurse Practitioner

## 2023-07-13 ENCOUNTER — Inpatient Hospital Stay: Admission: RE | Admit: 2023-07-13 | Payer: 59 | Source: Ambulatory Visit

## 2023-08-03 ENCOUNTER — Encounter: Payer: Self-pay | Admitting: Nurse Practitioner

## 2023-08-03 ENCOUNTER — Ambulatory Visit (INDEPENDENT_AMBULATORY_CARE_PROVIDER_SITE_OTHER): Payer: 59 | Admitting: Nurse Practitioner

## 2023-08-03 VITALS — BP 122/80 | HR 68 | Wt 128.8 lb

## 2023-08-03 DIAGNOSIS — S29019S Strain of muscle and tendon of unspecified wall of thorax, sequela: Secondary | ICD-10-CM | POA: Diagnosis not present

## 2023-08-03 DIAGNOSIS — Z23 Encounter for immunization: Secondary | ICD-10-CM

## 2023-08-03 DIAGNOSIS — L819 Disorder of pigmentation, unspecified: Secondary | ICD-10-CM | POA: Diagnosis not present

## 2023-08-03 MED ORDER — CYCLOBENZAPRINE HCL 5 MG PO TABS: 5.0000 mg | ORAL_TABLET | Freq: Three times a day (TID) | ORAL | 1 refills | Status: DC | PRN

## 2023-08-03 MED ORDER — MELOXICAM 15 MG PO TABS: 15.0000 mg | ORAL_TABLET | Freq: Every day | ORAL | 3 refills | Status: DC

## 2023-08-03 NOTE — Patient Instructions (Addendum)
 Rehabilitacin de la distensin torcica Thoracic Strain Rehab Pregunte al mdico qu ejercicios son seguros para usted. Haga los ejercicios exactamente como se lo haya dicho el mdico y gradelos como se lo haya indicado. Es normal sentir un leve estiramiento, tironeo, opresin o dentist al [employer] ejercicios. Detngase de inmediato si siente un dolor repentino o community education officer. No comience a hacer estos ejercicios hasta que se lo indique el mdico. Ejercicio de elongacin y amplitud de movimiento Este ejercicio calienta los msculos y las articulaciones, y mejora la movilidad y la flexibilidad de la espalda y los hombros. Este ejercicio tambin ayuda a engineer, materials. Estiramiento de pecho y columna  Acustese boca arriba sobre una superficie firme. Use una toalla o una manta pequea para armar un rollo de 4 pulgadas (10 cm) de dimetro. Coloque la toalla debajo de la parte media de la espalda de modo que quede debajo de la columna, pero no debajo de los omplatos. Coloque las manos detrs de la cabeza y deje caer los codos hacia los lados. Esto aumentar el estiramiento. Inspire profundamente (inhale). Mantenga esta posicin durante _____5-15_____ segundos. Reljese despus de espirar (exhalar). Repita _____2-3_____ veces. Realice este ejercicio _____2_____ veces al da.  Ejercicios de fortalecimiento Estos ejercicios fortalecen los msculos de la espalda y los omplatos, y les otorgan resistencia. La resistencia es la capacidad de usar los msculos durante un tiempo prolongado, incluso despus de que se cansen. Elevaciones alternadas de pierna y brazo  Apoye las palmas de las manos y las rodillas sobre una superficie firme. Si est sobre un suelo duro, puede usar un elemento acolchado, como una alfombrilla para ejercicios, para apoyar las rodillas. Alinee los brazos y las piernas. Las manos deben estar justo debajo de los hombros, y las rodillas debajo de la cadera. Eleve la  pierna [employer]. Al mismo tiempo, eleve el brazo derecho y associate professor frente a usted. No eleve la pierna por encima de la cadera. No eleve el brazo por encima del hombro. Mantenga los msculos del abdomen y de la espalda contrados. Mantenga la cadera mirando hacia el suelo. No arquee la espalda. Mantenga el equilibrio con cuidado. No contenga la respiracin. Mantenga esta posicin durante ____5-15______ segundos. Lentamente regrese a la posicin inicial y repita el ejercicio con la pierna derecha y el brazo izquierdo. Repita ____2-3______ veces. Realice este ejercicio _____2_____ veces al da. Remo con los brazos extendidos Este ejercicio tambin se denomina ejercicio de extensin de hombros. Prese con los pies separados a la distancia de los hombros. Ate una banda para ejercicios a un objeto estable que est frente a usted, de modo que la banda est por encima de la altura del hombro o en el mismo nivel. Sostenga un extremo de la banda para ejercicios en cada mano. Extienda los codos y bed bath & beyond a la altura de los hombros. Camine hacia atrs, alejndose del extremo fijo de la banda para ejercicios hasta que esta se tense. Junte los omplatos y nike costados de los muslos. Detngase cuando las manos estn en la misma posicin en ambos costados. Esto es la extensin de los hombros. No deje que las manos vayan hacia atrs del cuerpo. Mantenga esta posicin durante _____5-15_____ segundos. Vuelva lentamente a la posicin inicial. Repita _____2-3_____ veces. Realice este ejercicio _____2_____ veces al da. Remo con rotacin escapular En este ejercicio, los omplatos (escpulas) se acercan entre s (retraccin). Sintese en una silla estable que no tenga apoyabrazos o pngase de  pie. Ate una banda para ejercicios a un objeto estable que est frente a usted, de modo que la banda est a la altura del hombro. Sostenga un extremo de la banda para ejercicios en  cada mano. Las palmas deben estar enfrentadas. Lleve los brazos extendidos adelante. Camine hacia atrs, alejndose del extremo fijo de la banda para ejercicios hasta que esta se tense. Tire la [employer]. Mientras hace esto, flexione los codos y apple computer, pero evite mover el resto del cuerpo. No encoja los hombros hacia arriba al hacerlo. Detngase cuando los codos estn a los lados o ligeramente detrs del cuerpo. Mantenga esta posicin durante ____5-15______ segundos. Extienda lentamente los brazos para regresar a la posicin inicial. Repita ___2-3_______ veces. Realice este ejercicio ____2______ veces al da. Postura y mecnica corporal La buena postura y la mecnica corporal saludable pueden ayudar a acupuncturist estrs en las articulaciones y los tejidos del cuerpo. La water quality scientist se refiere a los movimientos y a las posiciones del cuerpo mientras realiza las actividades diarias. La postura es una parte de la water quality scientist. Ignacia buena postura significa que: La columna est en su posicin natural de curvatura en S (neutral). Los hombros estn w. r. berkley. La cabeza no est inclinada hacia adelante. Siga esas pautas para mejorar la postura y regulatory affairs officer en sus actividades diarias. De pie  Al estar de pie, mantenga la columna en la posicin neutral y los pies separados al ancho de caderas, aproximadamente. Mantenga las rodillas ligeramente flexionadas. [place], los hombros y la cadera deben estar alineados. Cuando realice una tarea en la que deba inclinarse hacia adelante y estar de pie en el mismo sitio durante mucho tiempo, coloque un pie en un objeto estable de 2 a 4 pulgadas (5 a 10 cm) de alto, como un taburete. Esto ayuda a que la columna mantenga una posicin neutral. Sentado  Cuando est sentado, mantenga la columna en posicin neutral y deje los pies apoyados en el suelo. Use un apoyapis si es necesario. Mantenga los muslos  paralelos al suelo. Evite redondear los hombros e inclinar la cabeza hacia adelante. Cuando trabaje en un escritorio o con una computadora, el escritorio debe estar a una altura en la que las manos estn un poco ms abajo que los codos. Deslice la silla debajo del escritorio, de modo de estar lo suficientemente cerca como para mantener una buena [place]. Cuando trabaje con una computadora, coloque el monitor a una altura que le permita mirar derecho hacia adelante, sin tener que inclinar la cabeza hacia adelante o [place] atrs. Reposo Al descansar o estar acostado, evite las posiciones que le causen ms dolor. Si siente dolor al hacer actividades que exigen sentarse, inclinarse, agacharse o ponerse en cuclillas (actividadesbasadas en la flexin), acustese en una posicin en la que el cuerpo no deba doblarse mucho. Por ejemplo, evite acurrucarse de costado con los brazos y las rodillas cerca del pecho (posicin fetal). Si siente dolor con las actividades que exigen estar de pie durante mucho tiempo o furniture conservator/restorer los brazos [phone] en la extensin), acustese con la columna en una posicin neutral y flexione ligeramente las rodillas. Pruebe con las siguientes posiciones: Acustese de costado con una almohada entre las rodillas. Acustese boca arriba con una almohada debajo de las rodillas.  Levantar objetos  Cuando tenga que levantar un objeto, mantenga los pies separados el ancho de los hombros, como [place], y apriete los msculos abdominales. Flexione las rodillas y la cadera, y mantenga la  columna en posicin neutral. Es importante levantarse utilizando la fuerza de las piernas, no la fuerza de la espalda. No trabe las rodillas hacia afuera. Siempre pida ayuda a otra persona para levantar objetos pesados o incmodos. Esta informacin no tiene theme park manager el consejo del mdico. Asegrese de hacerle al mdico cualquier pregunta que tenga. Document Revised: 04/08/2022 Document Reviewed:  04/08/2022 Elsevier Patient Education  2024 [employer].

## 2023-08-03 NOTE — Assessment & Plan Note (Signed)
 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 Pain is localized to the middle back and exacerbated by certain movements. Muscle strain has left the area sensitive. Ibuprofen provides partial relief, and muscle relaxants have been somewhat helpful. Stopped exercising since the COVID-19 pandemic, which may contribute to the issue. Stretching provides some relief. Discussed that an X-ray would not be beneficial as it only shows bones, and an MRI would be needed to visualize muscle damage. Explained that muscle damage is likely minor and will heal with proper care. Discussed the benefits of meloxicam  over ibuprofen for pain relief and muscle relaxation, emphasizing short-term use and safety given her strong liver and kidneys.   - Prescribe meloxicam  15 mg once daily   - Refer to physical therapy for guided exercises   - Provide written instructions for home stretching exercises   - Recommend hot baths with Epsom salt for muscle relaxation   - Advise continuation of muscle relaxants (5 mg) as needed, particularly at night

## 2023-08-03 NOTE — Progress Notes (Signed)
 Camie FORBES Doing, DNP, AGNP-c [facility] [address] [place], [place] [address] [phone]   ACUTE VISIT- ESTABLISHED PATIENT  Blood pressure 122/80, pulse 68, weight 128 lb 12.8 oz (58.4 kg).  Subjective:  HPI Allison Baker is a 67 y.o. female presents to day for evaluation of acute concern(s).   Allison Baker, a 67 year old with a history of musculoskeletal pain, presents with persistent mid-back pain. She is here with her niece who serves as an interpreter at her request. The pain is localized on the right and left of the spine in the thoracic region, does not radiate, and is not exacerbated by touch. The patient reports that the pain intensifies during work, particularly when using a machine that involves repetitive pulling down motions. The patient also notes that the pain is somewhat relieved by taking two ibuprofen tablets, but the relief is not complete.  The patient has previously been prescribed muscle relaxants, which provided some relief. However, the patient has run out of this medication. The patient also reports a history of a muscle strain at work, after which she sought massage therapy. This provided temporary relief, but the patient has since felt increased sensitivity in the area.  The patient has tried stretching exercises, which provide some relief. However, the patient reports that the pain is particularly bothersome upon waking and during movement. The patient also notes that she used to be more physically active but has reduced her exercise due to the COVID-19 pandemic.  The patient has not reported any other symptoms or changes in medication. The patient has not reported any changes in urinary or bowel habits, sleep patterns, appetite, or weight. The patient denies any history of trauma or injury to the back.   ROS negative except for what is listed in HPI. History, Medications, Surgery, SDOH, and Family History reviewed and updated as appropriate.   Objective:  Physical Exam Vitals and nursing note reviewed.  Constitutional:      General: She is not in acute distress.    Appearance: Normal appearance.  HENT:     Head: Normocephalic.  Eyes:     Conjunctiva/sclera: Conjunctivae normal.  Cardiovascular:     Rate and Rhythm: Normal rate and regular rhythm.     Pulses: Normal pulses.  Musculoskeletal:        General: Tenderness present. Normal range of motion.     Comments: Spasms and tenderness with movement to the posterior thoracic region, lateral to the spinal column. No erythema present.   Skin:    General: Skin is warm and dry.     Capillary Refill: Capillary refill takes less than 2 seconds.  Neurological:     General: No focal deficit present.     Mental Status: She is alert and oriented to person, place, and time.     Sensory: No sensory deficit.     Motor: No weakness.     Coordination: Coordination normal.  Psychiatric:        Mood and Affect: Mood normal.          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 Pain is localized to the middle back and exacerbated by certain movements. Muscle strain has left the area sensitive. Ibuprofen provides partial relief, and muscle relaxants have been somewhat helpful. Stopped exercising since the COVID-19 pandemic,  which may contribute to the issue. Stretching provides some relief. Discussed that an X-ray would not be beneficial as it only shows bones, and an MRI would be needed to visualize muscle damage. Explained that muscle damage is likely minor and will heal with proper care. Discussed the benefits of meloxicam  over ibuprofen for pain relief and muscle relaxation, emphasizing short-term use and safety given her strong liver and kidneys.   - Prescribe meloxicam  15 mg once daily   -  Refer to physical therapy for guided exercises   - Provide written instructions for home stretching exercises   - Recommend hot baths with Epsom salt for muscle relaxation   - Advise continuation of muscle relaxants (5 mg) as needed, particularly at night        Relevant Medications   cyclobenzaprine  (FLEXERIL ) 5 MG tablet   meloxicam  (MOBIC ) 15 MG tablet   Other Relevant Orders   Ambulatory referral to Physical Therapy   Discoloration of skin of face   Relevant Orders   Ambulatory referral to Dermatology   Other Visit Diagnoses       Need for influenza vaccination       Relevant Orders   Flu Vaccine Trivalent High Dose (Fluad) (Completed)         Camie FORBES Doing, DNP, AGNP-c

## 2023-08-16 ENCOUNTER — Ambulatory Visit: Payer: 59 | Admitting: Medical

## 2023-08-16 DIAGNOSIS — G8929 Other chronic pain: Secondary | ICD-10-CM | POA: Diagnosis not present

## 2023-08-16 DIAGNOSIS — M546 Pain in thoracic spine: Secondary | ICD-10-CM | POA: Diagnosis not present

## 2023-08-16 DIAGNOSIS — S29019D Strain of muscle and tendon of unspecified wall of thorax, subsequent encounter: Secondary | ICD-10-CM

## 2023-08-16 MED ORDER — TIZANIDINE HCL 4 MG PO TABS: 4.0000 mg | ORAL_TABLET | Freq: Two times a day (BID) | ORAL | 0 refills | Status: DC | PRN

## 2023-08-16 NOTE — Addendum Note (Signed)
 Addended by: Herminio Commons A on: 08/16/2023 03:52 PM   Modules accepted: Orders

## 2023-08-16 NOTE — Progress Notes (Signed)
 I have put in urgent referral for PT. Allison Baker would like her to be seen this week.   [name] I am not sure if [facility] or [facility]  or someone else that has interpreters so I put in referral to [facility].

## 2023-08-16 NOTE — Patient Instructions (Addendum)
 Please go to [facility] for your back xray.   Their hours are 8am - 4:30 pm Monday - Friday.  Take your insurance card with you.  [facility] [phone]   [address] [place], [place] [address]   We will refer to physical therapy    Vaya a [facility] para obtener una radiografa de espalda.   Su horario es de 8 am a 4:30 pm de lunes a viernes.  Lleve consigo su tarjeta de seguro.  Charlestine sheerer [place] [phone]   [address] [place], [place] [address]   Nos referiremos a la fisioterapia.

## 2023-08-16 NOTE — Progress Notes (Signed)
 Subjective:  Allison Baker is a 68 y.o. female who presents for Chief Complaint  Patient presents with   Back Pain     Here for c/o back pain.  We used interpreter on a stick, interpreter Jayson [phone]  First off, she did not realize she was not seeing Allison Baker her PCP today.  She says her sister made the appointment for her.  After little bit of discussion she decided to go ahead and do the appointment with me today.  She notes ongoing pains in the middle of her back for several months.  She has seen Allison Baker for this on a couple different office visits from August and December.  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  She often gets pain in her muscles in her mid back.  She does do stretching regularly.  She uses a  natural muscle rub.  She uses heat.  She was prescribed meloxicam  and Flexeril .  She says the Flexeril  helps sometimes but it makes her too sleepy.  The meloxicam  does not help as much as she thought it would.  She is having to take 2 at a time of the meloxicam .  She notes that she is supposed to be going to physical therapy massage therapy but has not heard back yet about those appointments.  She would like to have a CT scan of her back.  No fever, no arm or leg pain, no paresthesias numbness or tingling or weakness of the extremities, no incontinence, no belly pain  No other aggravating or relieving factors.    No other c/o.  Past Medical History:  Diagnosis Date   Acute bilateral low back pain without sciatica 09/10/2022   Dyslipidemia    Palpitations    Current Outpatient Medications on File Prior to Visit  Medication Sig Dispense Refill   cyclobenzaprine  (FLEXERIL ) 5 MG tablet Take 1 tablet (5 mg total) by mouth 3 (three) times daily as needed for muscle spasms. 60 tablet 1   ketoconazole  (NIZORAL ) 2 % cream Apply 1 Application topically daily. To arms. 30 g 0   meloxicam  (MOBIC ) 15 MG tablet Take 1  tablet (15 mg total) by mouth daily. 30 tablet 3   Multiple Vitamin (MULTIVITAMIN) tablet Take 1 tablet by mouth daily.     Vitamin D , Ergocalciferol , (DRISDOL ) 1.25 MG (50000 UNIT) CAPS capsule Take 1 capsule (50,000 Units total) by mouth every 7 (seven) days. 12 capsule 1   No current facility-administered medications on file prior to visit.     The following portions of the patient's history were reviewed and updated as appropriate: allergies, current medications, past family history, past medical history, past social history, past surgical history and problem list.  ROS Otherwise as in subjective above    Objective: There were no vitals taken for this visit.  General appearance: alert, no distress, well developed, well nourished No obvious scoliosis or deformity of the back, no obvious tenderness with palpation, range of motion within normal limits, Arms and legs with seemingly normal range of motion without palpable tenderness or deformity Arms and legs neurovascularly intact Nontender neck and upper back    Assessment: Encounter Diagnoses  Name Primary?   Chronic thoracic back pain, unspecified back pain laterality    Thoracic myofascial strain, subsequent encounter      Plan: We discussed her ongoing symptoms.  Symptoms suggest musculoskeletal back pain, myofascial pain.  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  Advise baseline x-ray given her concerns.  Advised against MRI or CT given that she has no radicular symptoms and no other red flag or worrisome symptoms.  She will go for baseline x-ray.  Continue with daily stretching, heat, topical muscle rub if desired.  Can continue meloxicam  1 tablet daily as needed.  We will try different muscle relaxer tizanidine  to see if she responds better to this  Referral today for physical therapy   Allison Baker was seen  today for back pain.  Diagnoses and all orders for this visit:  Chronic thoracic back pain, unspecified back pain laterality -     DG THORACOLUMBAR SPINE; Future  Thoracic myofascial strain, subsequent encounter -     DG THORACOLUMBAR SPINE; Future  Other orders -     tiZANidine  (ZANAFLEX ) 4 MG tablet; Take 1 tablet (4 mg total) by mouth 2 (two) times daily as needed for muscle spasms.  Advised in the future to make sure she or another family member scheduling her appointment should always ask for her PCP if that so she prefers  Follow up: pending xray, referral

## 2023-08-23 ENCOUNTER — Ambulatory Visit: Payer: 59 | Attending: Physical Therapy | Admitting: Physical Therapy

## 2023-08-23 NOTE — Therapy (Deleted)
OUTPATIENT PHYSICAL THERAPY THORACOLUMBAR EVALUATION   Patient Name: Allison Baker MRN: [id number] DOB:August 13, 1955, 68 y.o., female Today's Date: 08/23/2023  END OF SESSION:   Past Medical History:  Diagnosis Date   Acute bilateral low back pain without sciatica 09/10/2022   Dyslipidemia    Palpitations    Past Surgical History:  Procedure Laterality Date   FRACTURE SURGERY  08/2013   HAND   ORIF WRIST FRACTURE Right 08/24/2013   Procedure: OPEN REDUCTION INTERNAL FIXATION (ORIF) RIGHT WRIST FRACTURE;  Surgeon: Sharma Covert, MD;  Location: [facility] OR;  Service: Orthopedics;  Laterality: Right;   TUBAL LIGATION     Patient Active Problem List   Diagnosis Date Noted   Discoloration of skin of face 08/03/2023   Superficial fungal infection of skin 03/22/2023   Encounter for subsequent annual wellness visit (AWV) in Medicare patient 12/14/2022   Reactive airway disease, mild intermittent, uncomplicated 11/23/2022   Upper respiratory tract infection 11/23/2022   Axillary lymphadenopathy 11/23/2022   Hemorrhoids 05/02/2022   Paronychia of finger of left hand 05/02/2022   Thoracic myofascial strain 12/30/2021   Nasal cavity polyp 12/16/2021   Corn of toe 12/16/2021   Vitamin D deficiency 04/28/2021   Palpitation 01/25/2018   Mild hyperlipidemia 11/10/2017    PCP: ***  REFERRING PROVIDER: ***  REFERRING DIAG: ***  Rationale for Evaluation and Treatment: {HABREHAB:27488}  THERAPY DIAG:  No diagnosis found.  ONSET DATE: ***  SUBJECTIVE:                                                                                                                                                                                           SUBJECTIVE STATEMENT: ***  PERTINENT HISTORY:  Allison Baker, a 68 year old with a history of musculoskeletal pain, presents with persistent mid-back pain. She is here with her niece who serves as an interpreter at her request. The pain is localized on the right and  left of the spine in the thoracic region, does not radiate, and is not exacerbated by touch. The patient reports that the pain intensifies during work, particularly when using a machine that involves repetitive pulling down motions. The patient also notes that the pain is somewhat relieved by taking two ibuprofen tablets, but the relief is not complete.   The patient has previously been prescribed muscle relaxants, which provided some relief. However, the patient has run out of this medication. The patient also reports a history of a muscle strain at work, after which she sought massage therapy. This provided temporary relief, but the patient has since felt increased sensitivity in the area.   The patient has tried stretching exercises,  which provide some relief. However, the patient reports that the pain is particularly bothersome upon waking and during movement. The patient also notes that she used to be more physically active but has reduced her exercise due to the COVID-19 pandemic.   The patient has not reported any other symptoms or changes in medication. The patient has not reported any changes in urinary or bowel habits, sleep patterns, appetite, or weight. The patient denies any history of trauma or injury to the back.     PAIN:  Are you having pain? {OPRCPAIN:27236}  PRECAUTIONS: {Therapy precautions:24002}  RED FLAGS: {PT Red Flags:29287}   WEIGHT BEARING RESTRICTIONS: {Yes ***/No:24003}  FALLS:  Has patient fallen in last 6 months? {fallsyesno:27318}  LIVING ENVIRONMENT: Lives with: {OPRC lives with:25569::"lives with their family"} Lives in: {Lives in:25570} Stairs: {opstairs:27293} Has following equipment at home: {Assistive devices:23999}  OCCUPATION: ***  PLOF: {PLOF:24004}  PATIENT GOALS: ***  NEXT MD VISIT: ***  OBJECTIVE:  Note: Objective measures were completed at Evaluation unless otherwise noted.  DIAGNOSTIC FINDINGS:  ***  PATIENT SURVEYS:  {rehab  surveys:24030}  COGNITION: Overall cognitive status: {cognition:24006}     SENSATION: {sensation:27233}  MUSCLE LENGTH: Hamstrings: Right *** deg; Left *** deg Allison Fus test: Right *** deg; Left *** deg  POSTURE: {posture:25561}  PALPATION: ***  LUMBAR ROM:   AROM eval  Flexion   Extension   Right lateral flexion   Left lateral flexion   Right rotation   Left rotation    (Blank rows = not tested)  LOWER EXTREMITY ROM:     {AROM/PROM:27142}  Right eval Left eval  Hip flexion    Hip extension    Hip abduction    Hip adduction    Hip internal rotation    Hip external rotation    Knee flexion    Knee extension    Ankle dorsiflexion    Ankle plantarflexion    Ankle inversion    Ankle eversion     (Blank rows = not tested)  LOWER EXTREMITY MMT:    MMT Right eval Left eval  Hip flexion    Hip extension    Hip abduction    Hip adduction    Hip internal rotation    Hip external rotation    Knee flexion    Knee extension    Ankle dorsiflexion    Ankle plantarflexion    Ankle inversion    Ankle eversion     (Blank rows = not tested)  LUMBAR SPECIAL TESTS:  {lumbar special test:25242}  FUNCTIONAL TESTS:  {Functional tests:24029}  GAIT: Distance walked: *** Assistive device utilized: {Assistive devices:23999} Level of assistance: {Levels of assistance:24026} Comments: ***  TREATMENT DATE: ***                                                                                                                                 PATIENT EDUCATION:  Education details: *** Person educated: {Person educated:25204} Education  method: {Education Method:25205} Education comprehension: {Education Comprehension:25206}  HOME EXERCISE PROGRAM: ***  ASSESSMENT:  CLINICAL IMPRESSION: Patient is a *** y.o. *** who was seen today for physical therapy evaluation and treatment for ***.   OBJECTIVE IMPAIRMENTS: {opptimpairments:25111}.   ACTIVITY LIMITATIONS:  {activitylimitations:27494}  PARTICIPATION LIMITATIONS: {participationrestrictions:25113}  PERSONAL FACTORS: {Personal factors:25162} are also affecting patient's functional outcome.   REHAB POTENTIAL: {rehabpotential:25112}  CLINICAL DECISION MAKING: {clinical decision making:25114}  EVALUATION COMPLEXITY: {Evaluation complexity:25115}   GOALS: Goals reviewed with patient? {yes/no:20286}  SHORT TERM GOALS: Target date: ***  *** Baseline: Goal status: INITIAL  2.  *** Baseline:  Goal status: INITIAL  3.  *** Baseline:  Goal status: INITIAL  4.  *** Baseline:  Goal status: INITIAL  5.  *** Baseline:  Goal status: INITIAL  6.  *** Baseline:  Goal status: INITIAL  LONG TERM GOALS: Target date: ***  *** Baseline:  Goal status: INITIAL  2.  *** Baseline:  Goal status: INITIAL  3.  *** Baseline:  Goal status: INITIAL  4.  *** Baseline:  Goal status: INITIAL  5.  *** Baseline:  Goal status: INITIAL  6.  *** Baseline:  Goal status: INITIAL  PLAN:  PT FREQUENCY: {rehab frequency:25116}  PT DURATION: {rehab duration:25117}  PLANNED INTERVENTIONS: {rehab planned interventions:25118::"97110-Therapeutic exercises","97530- Therapeutic [phone]- Neuromuscular re-education","97535- Self HKVQ","25956- Manual therapy"}.  PLAN FOR NEXT SESSION: ***   Kadesia Robel, PT 08/23/2023, 7:43 AM

## 2024-04-24 ENCOUNTER — Ambulatory Visit (INDEPENDENT_AMBULATORY_CARE_PROVIDER_SITE_OTHER): Admitting: Nurse Practitioner

## 2024-04-24 ENCOUNTER — Encounter: Payer: Self-pay | Admitting: Nurse Practitioner

## 2024-04-24 DIAGNOSIS — Z23 Encounter for immunization: Secondary | ICD-10-CM | POA: Diagnosis not present

## 2024-04-24 DIAGNOSIS — Z13820 Encounter for screening for osteoporosis: Secondary | ICD-10-CM

## 2024-04-24 DIAGNOSIS — Z0000 Encounter for general adult medical examination without abnormal findings: Secondary | ICD-10-CM | POA: Diagnosis not present

## 2024-04-24 DIAGNOSIS — L309 Dermatitis, unspecified: Secondary | ICD-10-CM | POA: Insufficient documentation

## 2024-04-24 DIAGNOSIS — F4323 Adjustment disorder with mixed anxiety and depressed mood: Secondary | ICD-10-CM | POA: Diagnosis not present

## 2024-04-24 DIAGNOSIS — E785 Hyperlipidemia, unspecified: Secondary | ICD-10-CM | POA: Diagnosis not present

## 2024-04-24 DIAGNOSIS — E559 Vitamin D deficiency, unspecified: Secondary | ICD-10-CM | POA: Diagnosis not present

## 2024-04-24 DIAGNOSIS — Z130 Encounter for screening for diseases of the blood and blood-forming organs and certain disorders involving the immune mechanism: Secondary | ICD-10-CM

## 2024-04-24 DIAGNOSIS — F5104 Psychophysiologic insomnia: Secondary | ICD-10-CM | POA: Insufficient documentation

## 2024-04-24 DIAGNOSIS — M546 Pain in thoracic spine: Secondary | ICD-10-CM | POA: Diagnosis not present

## 2024-04-24 DIAGNOSIS — Z1329 Encounter for screening for other suspected endocrine disorder: Secondary | ICD-10-CM

## 2024-04-24 DIAGNOSIS — G8929 Other chronic pain: Secondary | ICD-10-CM | POA: Insufficient documentation

## 2024-04-24 MED ORDER — TRIAMCINOLONE ACETONIDE 0.1 % EX CREA
1.0000 | TOPICAL_CREAM | Freq: Two times a day (BID) | CUTANEOUS | 3 refills | Status: AC
Start: 2024-04-24 — End: ?

## 2024-04-24 NOTE — Patient Instructions (Addendum)
 I sent a referral to the physical therapy office. They will call you to get you scheduled. I have asked for a therapist that will do treatments with dry needle procedures to help release the muscles. We will have to see if your insurance will cover this.   Keep doing your stretches at home. We can also send a referral to the spinal specialist, if you would like. Just send me a message and let me know if you would like this.   I sent the referral for a counselor to help work through some of the feelings you are having. I have asked for a spanish speaking person. This is ALL confidential. They will contact you to schedule this ASAP.  You can continue to use the magnesium glyconate 400mg  and melatonin to help with your sleep. I believe that therapy/counseling will also help with this.   If you need to make an appointment, please call the office and ask to speak with [facility] and she can help get you in to see me sooner.   The imaging center will call you to set up your bone density scan.    Cmo sobrellevar la depresin en los adultos Managing Depression, Adult La depresin es una afeccin de salud mental que puede influir en los pensamientos, los sentimientos y [place]. Recibir el diagnstico de depresin puede traerle alivio si no saba por qu se senta o se comportaba de Chiropodist. Tambin podra hacer que se sienta abrumado. Encontrar formas de [employer] sntomas puede ayudarlo a sentirse ms positivo sobre el futuro. Cmo manejar los cambios en el estilo de vida Estar deprimido es difcil. La depresin puede aumentar el nivel de estrs diario. El estrs puede [employer] sntomas de la depresin. Es posible que crea que sus sntomas no se pueden controlar o que nunca mejorarn. Sin embargo, hay muchas cosas que puede intentar para ayudar a Chief Operating Officer sus sntomas. Hay esperanza. Control del estrs  El estrs es la reaccin del cuerpo ante los cambios y los  acontecimientos de la vida, tanto buenos [place]. El estrs puede aumentar los sentimientos de depresin. Aprender a controlar el estrs puede ayudar a disminuir los sentimientos de depresin. Pruebe algunos de los siguientes enfoques para reducir Development worker, community (tcnicas de reduccin del estrs): Escuche msica que le guste y que lo inspire. Intente usar una aplicacin de meditacin o tome una clase de meditacin. Desarrolle una prctica que le ayude a conectarse con su ser espiritual. Camine al [employer], rece o vaya a un lugar de culto. Practique respiracin profunda. Para hacerlo, inhale lentamente por la nariz. Haga una pausa cuando alcance el mximo de la inhalacin durante unos segundos y luego exhale lentamente, dejando que los msculos se relajen. Reptalo tres o cuatro veces. Practique yoga para relajarse y trabajar con los msculos. Elija una tcnica de reduccin del estrs que le funcione. Desarrollar estas tcnicas lleva tiempo y prctica. Resrvese de 5 a 15 minutos por da para Associate Professor. Algunos terapeutas pueden ofrecerle capacitacin para aprenderlas. Haga lo siguiente para ayudar a Sales executive estrs: Lleve un diario. Conozca sus lmites. Establezca lmites saludables para usted y [place], como decir "no" cuando crea que algo es [place]. Preste atencin a cmo reacciona ante ciertas situaciones. Es posible que no pueda controlar todo, pero s puede controlar su reaccin. Aada humor a su vida viendo comedias o programas cmicos. Hgase tiempo para las actividades que disfruta y que la [place]. Dedique menos tiempo al uso de  dispositivos electrnicos, en especial por la noche, antes de acostarse. La luz de las pantallas puede hacer que su cerebro piense que es hora de levantarse en lugar de [place].  Medicamentos Los medicamentos, como los antidepresivos, suelen ser parte del tratamiento para la depresin. Hable con su farmacutico o mdico sobre [employer], suplementos y productos a base de hierbas que toma, sus posibles efectos secundarios, y qu medicamentos y otros productos que se pueden tomar al mismo tiempo sin correr riesgos. Es importante que informe a su mdico si tiene efectos secundarios. Las [employer] mdico puede sugerirle terapia familiar, terapia de pareja o terapia individual como parte del tratamiento. Cmo reconocer los [employer] persona tiene una respuesta distinta al tratamiento para la depresin. A medida que se recupere de la depresin, puede comenzar a hacer lo siguiente: Tener ms inters en hacer sus actividades. Sentirse ms esperanzado. Tener ms energa. Consumir una cantidad ms regular de alimentos. Tener mejor atencin mental. Es importante reconocer si la depresin no mejora o si empeora. Los sntomas que tuvo en el comienzo pueden volver, por ejemplo: Cansancio. [place]er demasiado o muy poco. Dormir demasiado o muy poco. Sentirse agotado, agitado o desesperanzado. Dificultad para concentrarse o para tomar decisiones. Tener dolores y molestias inexplicables. Sentirse irritable, enojado o agresivo. Si usted o sus familiares advierten que estos sntomas regresan, infrmeselo al mdico de inmediato. Siga estas indicaciones en su casa: Actividad Trate de Social research officer, government forma de ejercicio todos los [place], Corporate treasurer. Pruebe yoga, meditacin mindfulness u otras tcnicas de reduccin del estrs. Participe en actividades grupales si puede. Estilo de vida [place] lo suficiente. Reduzca o deje de consumir cafena, tabaco, bebidas alcohlicas y cualquier otra sustancia potencialmente peligrosa. Siga una dieta saludable que incluya abundantes frutas, verduras, cereales integrales, productos lcteos descremados y protenas magras. Limite el consumo de alimentos con alto contenido de grasas slidas, azcar agregado y sal (sodio). Indicaciones generales Use los medicamentos de venta libre y los recetados  solamente como se lo haya indicado el mdico. Concurra a todas las visitas de seguimiento. Es importante que el mdico controle su estado de nimo, comportamiento y Media planner. El mdico puede necesitar cambios en su tratamiento. Dnde obtener apoyo Hablar con otras personas  Los amigos y familiares pueden ser fuentes de apoyo y orientacin. Hable con amigos o familiares de confianza sobre su afeccin. Explqueles cules son sus sntomas e infrmeles que est trabajando con un mdico para tratar la depresin. Dgales a sus amigos y familiares cmo pueden ayudarlo. Finanzas Busque proveedores de [employer] se adapten a su situacin financiera. Hable con el mdico si le preocupa el acceso a alimentos, vivienda o medicamentos. Llame a su aseguradora para obtener informacin sobre sus copagos y su plan de medicamentos recetados. Dnde obtener ms informacin Puede encontrar apoyo en su zona en: Anxiety and Depression Association of America [ADAA] (Asociacin de Ansiedad y Depresin de los Estados Unidos): adaa.org Mental Health [place] (Salud Mental de los [place] Unidos): mentalhealthamerica.net The First American on Mental Illness (Alianza Nacional Sobre Enfermedades Mentales): nami.org Comunquese con un mdico si: Deja de tomar los medicamentos antidepresivos y tiene alguno de estos sntomas: Nuseas. Dolor de cabeza. Desvanecimiento. Escalofros y dolores corporales. Imposibilidad de dormir (insomnio). Usted o algn amigo o familiar advierten que su depresin est empeorando. Solicite ayuda de inmediato si: Piensa acerca de lastimarse o lastimar a Economist. Busque ayuda de inmediato si alguna vez siente que puede hacerse dao a usted mismo o a otros, o tiene pensamientos de  poner fin a su vida. Dirjase al centro de urgencias ms cercano o: Llame al 911. Llame a National Suicide Prevention Lifeline (Lnea Telefnica Nacional para la Prevencin del Suicidio) al 2103831772  o al 988. Est disponible las 24 horas del da. Enve un mensaje de texto a la lnea para casos de crisis al [phone]. Esta informacin no tiene Theme park manager el consejo del mdico. Asegrese de hacerle al mdico cualquier pregunta que tenga. Document Revised: 12/16/2021 Document Reviewed: 12/16/2021 Elsevier Patient Education  2024 [employer].

## 2024-04-24 NOTE — Assessment & Plan Note (Signed)
 Insomnia likely related to emotional distress and anxiety. Reports difficulty sleeping for nearly a year, with some improvement using magnesium. Prefers not to use sleep medications. - Continue magnesium supplementation - Consider adding melatonin to magnesium regimen for improved sleep

## 2024-04-24 NOTE — Assessment & Plan Note (Signed)
 Intermittent pruritic rash in the ear, possibly psoriasis. Rash is itchy but not currently visible. Coconut oil provides some relief. - Prescribe topical cream for psoriasis to alleviate itching

## 2024-04-24 NOTE — Assessment & Plan Note (Signed)
 Chronic thoracic back pain with mild spasms noted bilaterally. No significant pain with lifting or bending, but rest makes it worse. Massage provides temporary relief. She is under a tremendous amount of stress with family circumstances, which may be contributing to her tightness. Will send referral to PT for now with hopes of dry needling.  - Refer to physical therapy for pain management and muscle relaxation techniques, including massage and dry needling - If no improvement, recommend x-ray and ortho evaluation

## 2024-04-24 NOTE — Progress Notes (Signed)
 Allison Doing, DNP, AGNP-c [facility] [address] [place], [place] [address] Main Office [phone] VISIT TYPE: CPE on 04/24/2024 There were no vitals filed for this visit. There is no height or weight on file to calculate BMI. There were no vitals taken for this visit.  Subjective:    Patient ID: Allison Baker, female    DOB: 06-19-1956, 68 y.o.   MRN: [id number]  HPI: History of Present Illness Allison Baker is a 68 year old female who presents for her annual exam. She has a Nurse, learning disability present.   She experiences daily back pain, described as constant, which improves with exercise and stretching but persists throughout the day. Various medications have been tried without relief, except for tizanidine , which has been somewhat effective, though she has not been taking it consistently. The pain does not worsen with lifting or bending but is more pronounced when inactive or after lying down for extended periods. Self-massage provides some relief. She is interested in PT to see if this helps.   She has been experiencing emotional distress related to family issues, including estrangement from her daughter and grandchildren for the past year, contributing to feelings of depression and loneliness. She wants counseling to address these emotional challenges and has a history of fleeting suicidal thoughts (without a plan) during this period of emotional turmoil. She denies SI at this time. She declines medication management. She is very concerned about the aspect of privacy due to the nature of the estrangement.  She has difficulty sleeping, attributing it to ongoing stress and emotional state. Magnesium has been used to aid sleep, providing some improvement. She has previously tried melatonin but is cautious about mixing supplements.  She mentions experiencing dry, itchy ears occasionally, managed with coconut oil application. No swelling in her feet.  Pertinent items are noted in  HPI. DEXA order placed today Most Recent Depression Screen:     04/24/2024    9:11 AM 12/14/2022   10:15 AM 12/30/2021   10:04 AM 12/16/2021   10:45 AM 04/29/2021    3:32 AM  Depression screen PHQ 2/9  Decreased Interest 0 0 0 0 0  Down, Depressed, Hopeless 1 0 0 0 0  PHQ - 2 Score 1 0 0 0 0   Most Recent Anxiety Screen:      No data to display         Most Recent Fall Screen:    04/24/2024    9:10 AM 12/14/2022   10:15 AM 09/01/2022    2:05 PM 12/30/2021   10:03 AM 12/16/2021   10:45 AM  Fall Risk   Falls in the past year? 0 0 0 0 0  Number falls in past yr: 0 0 0 0 0  Injury with Fall? 0 0 0 0 0  Risk for fall due to : No Fall Risks No Fall Risks No Fall Risks No Fall Risks;Other (Comment) No Fall Risks  Follow up Falls evaluation completed Falls evaluation completed Falls evaluation completed Falls evaluation completed;Education provided  Falls evaluation completed;Education provided      Data saved with a previous flowsheet row definition    Past medical history, surgical history, medications, allergies, family history and social history reviewed with patient today and changes made to appropriate areas of the chart.  Past Medical History:  Past Medical History:  Diagnosis Date   Acute bilateral low back pain without sciatica 09/10/2022   Dyslipidemia    Palpitation 01/25/2018   Palpitations    Superficial fungal  infection of skin 03/22/2023   Thoracic myofascial strain 12/30/2021   Upper respiratory tract infection 11/23/2022   Medications:  Current Outpatient Medications on File Prior to Visit  Medication Sig   ketoconazole  (NIZORAL ) 2 % cream Apply 1 Application topically daily. To arms.   Multiple Vitamin (MULTIVITAMIN) tablet Take 1 tablet by mouth daily.   Vitamin D , Ergocalciferol , (DRISDOL ) 1.25 MG (50000 UNIT) CAPS capsule Take 1 capsule (50,000 Units total) by mouth every 7 (seven) days.   cyclobenzaprine  (FLEXERIL ) 5 MG tablet Take 1 tablet (5 mg total)  by mouth 3 (three) times daily as needed for muscle spasms. (Patient not taking: Reported on 04/24/2024)   meloxicam  (MOBIC ) 15 MG tablet Take 1 tablet (15 mg total) by mouth daily. (Patient not taking: Reported on 04/24/2024)   tiZANidine  (ZANAFLEX ) 4 MG tablet Take 1 tablet (4 mg total) by mouth 2 (two) times daily as needed for muscle spasms. (Patient not taking: Reported on 04/24/2024)   No current facility-administered medications on file prior to visit.   Surgical History:  Past Surgical History:  Procedure Laterality Date   FRACTURE SURGERY  08/2013   HAND   ORIF WRIST FRACTURE Right 08/24/2013   Procedure: OPEN REDUCTION INTERNAL FIXATION (ORIF) RIGHT WRIST FRACTURE;  Surgeon: Prentice LELON Pagan, MD;  Location: [facility] OR;  Service: Orthopedics;  Laterality: Right;   TUBAL LIGATION     Allergies:  No Known Allergies Family History:  Family History  Problem Relation Age of Onset   Cancer Father        leukemia   Cancer Sister        ovarian   Cancer Brother        brain   Colon cancer Neg Hx    Esophageal cancer Neg Hx    Rectal cancer Neg Hx    Stomach cancer Neg Hx        Objective:    There were no vitals taken for this visit.  Wt Readings from Last 3 Encounters:  08/03/23 128 lb 12.8 oz (58.4 kg)  03/22/23 139 lb (63 kg)  12/14/22 139 lb 6.4 oz (63.2 kg)    Physical Exam Vitals and nursing note reviewed.  Constitutional:      General: She is not in acute distress.    Appearance: Normal appearance.  HENT:     Head: Normocephalic and atraumatic.     Right Ear: Hearing, tympanic membrane, ear canal and external ear normal.     Left Ear: Hearing, tympanic membrane, ear canal and external ear normal.     Nose: Nose normal.     Right Sinus: No maxillary sinus tenderness or frontal sinus tenderness.     Left Sinus: No maxillary sinus tenderness or frontal sinus tenderness.     Mouth/Throat:     Lips: Pink.     Mouth: Mucous membranes are moist.     Pharynx: Oropharynx is  clear.  Eyes:     General: Lids are normal. Vision grossly intact.     Extraocular Movements: Extraocular movements intact.     Conjunctiva/sclera: Conjunctivae normal.     Pupils: Pupils are equal, round, and reactive to light.     Funduscopic exam:    Right eye: Red reflex present.        Left eye: Red reflex present.    Visual Fields: Right eye visual fields normal and left eye visual fields normal.  Neck:     Thyroid: No thyromegaly.     Vascular: No carotid  bruit.  Cardiovascular:     Rate and Rhythm: Normal rate and regular rhythm.     Chest Wall: PMI is not displaced.     Pulses: Normal pulses.          Dorsalis pedis pulses are 2+ on the right side and 2+ on the left side.       Posterior tibial pulses are 2+ on the right side and 2+ on the left side.     Heart sounds: Normal heart sounds. No murmur heard. Pulmonary:     Effort: Pulmonary effort is normal. No respiratory distress.     Breath sounds: Normal breath sounds.  Abdominal:     General: Abdomen is flat. Bowel sounds are normal. There is no distension.     Palpations: Abdomen is soft. There is no hepatomegaly, splenomegaly or mass.     Tenderness: There is no abdominal tenderness. There is no right CVA tenderness, left CVA tenderness, guarding or rebound.  Musculoskeletal:        General: Tenderness present. Normal range of motion.       Arms:     Cervical back: Full passive range of motion without pain, normal range of motion and neck supple. No tenderness.     Right lower leg: No edema.     Left lower leg: No edema.     Comments: Tenderness reported in the posterior thoracic region. No visible concerns present. Muscles mildly tight.   Feet:     Left foot:     Toenail Condition: Left toenails are normal.  Lymphadenopathy:     Cervical: No cervical adenopathy.     Upper Body:     Right upper body: No supraclavicular adenopathy.     Left upper body: No supraclavicular adenopathy.  Skin:    General: Skin is  warm and dry.     Capillary Refill: Capillary refill takes less than 2 seconds.     Nails: There is no clubbing.     Comments: Small erythematous plaques with white scaling noted on the base of the occipital hairline.   Neurological:     General: No focal deficit present.     Mental Status: She is alert and oriented to person, place, and time.     GCS: GCS eye subscore is 4. GCS verbal subscore is 5. GCS motor subscore is 6.     Sensory: Sensation is intact.     Motor: Motor function is intact.     Coordination: Coordination is intact.     Gait: Gait is intact.     Deep Tendon Reflexes: Reflexes are normal and symmetric.  Psychiatric:        Attention and Perception: Attention normal.        Mood and Affect: Mood normal. Affect is tearful.        Speech: Speech normal.        Behavior: Behavior is cooperative.        Thought Content: Thought content normal.        Cognition and Memory: Cognition and memory normal.        Judgment: Judgment normal.      Results for orders placed or performed in visit on 12/14/22  CBC with Differential/Platelet   Collection Time: 12/14/22 11:18 AM  Result Value Ref Range   WBC 5.9 3.4 - 10.8 x10E3/uL   RBC 4.54 3.77 - 5.28 x10E6/uL   Hemoglobin 13.6 11.1 - 15.9 g/dL   Hematocrit 59.4 65.9 - 46.6 %  MCV 89 79 - 97 fL   MCH 30.0 26.6 - 33.0 pg   MCHC 33.6 31.5 - 35.7 g/dL   RDW 87.4 88.2 - 84.5 %   Platelets 308 150 - 450 x10E3/uL   Neutrophils 43 Not Estab. %   Lymphs 43 Not Estab. %   Monocytes 10 Not Estab. %   Eos 3 Not Estab. %   Basos 1 Not Estab. %   Neutrophils Absolute 2.6 1.4 - 7.0 x10E3/uL   Lymphocytes Absolute 2.5 0.7 - 3.1 x10E3/uL   Monocytes Absolute 0.6 0.1 - 0.9 x10E3/uL   EOS (ABSOLUTE) 0.2 0.0 - 0.4 x10E3/uL   Basophils Absolute 0.0 0.0 - 0.2 x10E3/uL   Immature Granulocytes 0 Not Estab. %   Immature Grans (Abs) 0.0 0.0 - 0.1 x10E3/uL  Comprehensive metabolic panel   Collection Time: 12/14/22 11:18 AM  Result Value  Ref Range   Glucose 82 70 - 99 mg/dL   BUN 11 8 - 27 mg/dL   Creatinine, Ser 9.32 0.57 - 1.00 mg/dL   eGFR 95 >40 fO/fpw/8.26   BUN/Creatinine Ratio 16 12 - 28   Sodium 141 134 - 144 mmol/L   Potassium 4.8 3.5 - 5.2 mmol/L   Chloride 106 96 - 106 mmol/L   CO2 23 20 - 29 mmol/L   Calcium  9.5 8.7 - 10.3 mg/dL   Total Protein 6.9 6.0 - 8.5 g/dL   Albumin 4.4 3.9 - 4.9 g/dL   Globulin, Total 2.5 1.5 - 4.5 g/dL   Albumin/Globulin Ratio 1.8 1.2 - 2.2   Bilirubin Total 0.5 0.0 - 1.2 mg/dL   Alkaline Phosphatase 115 44 - 121 IU/L   AST 20 0 - 40 IU/L   ALT 18 0 - 32 IU/L  Lipid panel   Collection Time: 12/14/22 11:18 AM  Result Value Ref Range   Cholesterol, Total 209 (H) 100 - 199 mg/dL   Triglycerides 85 0 - 149 mg/dL   HDL 69 >60 mg/dL   VLDL Cholesterol Cal 15 5 - 40 mg/dL   LDL Chol Calc (NIH) 874 (H) 0 - 99 mg/dL   Chol/HDL Ratio 3.0 0.0 - 4.4 ratio  VITAMIN D  25 Hydroxy (Vit-D Deficiency, Fractures)   Collection Time: 12/14/22 11:18 AM  Result Value Ref Range   Vit D, 25-Hydroxy 27.2 (L) 30.0 - 100.0 ng/mL       Assessment & Plan:   Problem List Items Addressed This Visit     Encounter for annual physical exam - Primary   CPE completed today. Review of HM activities and recommendations discussed and provided on AVS. Anticipatory guidance, diet, and exercise recommendations provided. Medications, allergies, and hx reviewed and updated as necessary. Orders placed as listed below.  Plan: - Labs ordered. Will make changes as necessary based on results.  - I will review these results and send recommendations via MyChart or a telephone call.  - F/U with CPE in 1 year or sooner for acute/chronic health needs as directed.        Chronic bilateral thoracic back pain   Chronic thoracic back pain with mild spasms noted bilaterally. No significant pain with lifting or bending, but rest makes it worse. Massage provides temporary relief. She is under a tremendous amount of stress  with family circumstances, which may be contributing to her tightness. Will send referral to PT for now with hopes of dry needling.  - Refer to physical therapy for pain management and muscle relaxation techniques, including massage and dry needling -  If no improvement, recommend x-ray and ortho evaluation      Relevant Orders   Ambulatory referral to Physical Therapy   Situational mixed anxiety and depressive disorder   Major depressive disorder with significant emotional distress related to family issues, including estrangement from daughter and grandchildren. Symptoms include feelings of loneliness, emotional burden, and past suicidal ideation. She expresses desire for counseling and confidentiality in therapy. Referral placed for counseling services. A spanish speaking therapist would be ideal, if possible.  - Refer to a counselor or therapist for mental health support - Ensure referral is marked as urgent for prompt follow-up      Relevant Orders   Ambulatory referral to Psychology   TSH   Eczema   Intermittent pruritic rash in the ear, possibly psoriasis. Rash is itchy but not currently visible. Coconut oil provides some relief. - Prescribe topical cream for psoriasis to alleviate itching      Relevant Medications   triamcinolone  cream (KENALOG ) 0.1 %   Psychophysiological insomnia   Insomnia likely related to emotional distress and anxiety. Reports difficulty sleeping for nearly a year, with some improvement using magnesium. Prefers not to use sleep medications. - Continue magnesium supplementation - Consider adding melatonin to magnesium regimen for improved sleep      Relevant Orders   Ambulatory referral to Psychology   TSH   Vitamin D  deficiency   Relevant Orders   CBC with Differential/Platelet   CMP14+EGFR   VITAMIN D  25 Hydroxy (Vit-D Deficiency, Fractures)   TSH   Mild hyperlipidemia   Relevant Orders   CBC with Differential/Platelet   CMP14+EGFR   Lipid panel    Other Visit Diagnoses       Need for vaccination against Streptococcus pneumoniae       Relevant Orders   Pneumococcal conjugate vaccine 20-valent (Prevnar 20) (Completed)     Need for influenza vaccination       Relevant Orders   Flu vaccine HIGH DOSE PF(Fluzone Trivalent) (Completed)     Screening for endocrine, nutritional, metabolic and immunity disorder       Relevant Orders   CBC with Differential/Platelet   CMP14+EGFR   Lipid panel   VITAMIN D  25 Hydroxy (Vit-D Deficiency, Fractures)     Screening for osteoporosis       Relevant Orders   DG Bone Density       Assessment and Plan Assessment & Plan       Follow up plan: Return in about 3 months (around 07/24/2024) for F/U Mood.  NEXT PREVENTATIVE PHYSICAL DUE IN 1 YEAR.  PATIENT COUNSELING PROVIDED FOR ALL ADULT PATIENTS: A well balanced diet low in saturated fats, cholesterol, and moderation in carbohydrates.  This can be as simple as monitoring portion sizes and cutting back on sugary beverages such as soda and juice to start with.    Daily water consumption of at least 64 ounces.  Physical activity at least 180 minutes per week.  If just starting out, start 10 minutes a day and work your way up.   This can be as simple as taking the stairs instead of the elevator and walking 2-3 laps around the office  purposefully every day.   STD protection, partner selection, and regular testing if high risk.  Limited consumption of alcoholic beverages if alcohol is consumed. For men, I recommend no more than 14 alcoholic beverages per week, spread out throughout the week (max 2 per day). Avoid binge drinking or consuming large quantities of alcohol in one  setting.  Please let me know if you feel you may need help with reduction or quitting alcohol consumption.   Avoidance of nicotine, if used. Please let me know if you feel you may need help with reduction or quitting nicotine use.   Daily mental health  attention. This can be in the form of 5 minute daily meditation, prayer, journaling, yoga, reflection, etc.  Purposeful attention to your emotions and mental state can significantly improve your overall wellbeing  and  Health.  Please know that I am here to help you with all of your health care goals and am happy to work with you to find a solution that works best for you.  The greatest advice I have received with any changes in life are to take it one step at a time, that even means if all you can focus on is the next 60 seconds, then do that and celebrate your victories.  With any changes in life, you will have set backs, and that is OK. The important thing to remember is, if you have a set back, it is not a failure, it is an opportunity to try again! Screening Testing Mammogram Every 1 -2 years based on history and risk factors Starting at age 69 Pap Smear Ages 21-39 every 3 years Ages 47-65 every 5 years with HPV testing More frequent testing may be required based on results and history Colon Cancer Screening Every 1-10 years based on test performed, risk factors, and history Starting at age 34 Bone Density Screening Every 2-10 years based on history Starting at age 70 for women Recommendations for men differ based on medication usage, history, and risk factors AAA Screening One time ultrasound Men 62-40 years old who have every smoked Lung Cancer Screening Low Dose Lung CT every 12 months Age 63-80 years with a 30 pack-year smoking history who still smoke or who have quit within the last 15 years   Screening Labs Routine  Labs: Complete Blood Count (CBC), Complete Metabolic Panel (CMP), Cholesterol (Lipid Panel) Every 6-12 months based on history and medications May be recommended more frequently based on current conditions or previous results Hemoglobin A1c Lab Every 3-12 months based on history and previous results Starting at age 74 or earlier with diagnosis of diabetes,  high cholesterol, BMI >26, and/or risk factors Frequent monitoring for patients with diabetes to ensure blood sugar control Thyroid Panel (TSH) Every 6 months based on history, symptoms, and risk factors May be repeated more often if on medication HIV One time testing for all patients 37 and older May be repeated more frequently for patients with increased risk factors or exposure Hepatitis C One time testing for all patients 22 and older May be repeated more frequently for patients with increased risk factors or exposure Gonorrhea, Chlamydia Every 12 months for all sexually active persons 13-24 years Additional monitoring may be recommended for those who are considered high risk or who have symptoms Every 12 months for any woman on birth control, regardless of sexual activity PSA Men 70-80 years old with risk factors Additional screening may be recommended from age 55-69 based on risk factors, symptoms, and history  Vaccine Recommendations Tetanus Booster All adults every 10 years Flu Vaccine All patients 6 months and older every year COVID Vaccine All patients 12 years and older Initial dosing with booster May recommend additional booster based on age and health history HPV Vaccine 2 doses all patients age 34-26 Dosing may be considered for patients over 23  Shingles Vaccine (Shingrix) 2 doses all adults 55 years and older Pneumonia (Pneumovax 23) All adults 65 years and older May recommend earlier dosing based on health history One year apart from Prevnar 13 Pneumonia (Prevnar 58) All adults 65 years and older Dosed 1 year after Pneumovax 23 Pneumonia (Prevnar 20) One time alternative to the two dosing of 13 and 23 For all adults with initial dose of 23, 20 is recommended 1 year later For all adults with initial dose of 13, 23 is still recommended as second option 1 year later

## 2024-04-24 NOTE — Assessment & Plan Note (Signed)
 Major depressive disorder with significant emotional distress related to family issues, including estrangement from daughter and grandchildren. Symptoms include feelings of loneliness, emotional burden, and past suicidal ideation. She expresses desire for counseling and confidentiality in therapy. Referral placed for counseling services. A spanish speaking therapist would be ideal, if possible.  - Refer to a counselor or therapist for mental health support - Ensure referral is marked as urgent for prompt follow-up

## 2024-04-24 NOTE — Assessment & Plan Note (Signed)

## 2024-04-25 LAB — CBC WITH DIFFERENTIAL/PLATELET
Basophils Absolute: 0.1 x10E3/uL (ref 0.0–0.2)
Basos: 1 %
EOS (ABSOLUTE): 0.1 x10E3/uL (ref 0.0–0.4)
Eos: 2 %
Hematocrit: 43.3 % (ref 34.0–46.6)
Hemoglobin: 13.9 g/dL (ref 11.1–15.9)
Immature Grans (Abs): 0 x10E3/uL (ref 0.0–0.1)
Immature Granulocytes: 0 %
Lymphocytes Absolute: 2.4 x10E3/uL (ref 0.7–3.1)
Lymphs: 35 %
MCH: 30.8 pg (ref 26.6–33.0)
MCHC: 32.1 g/dL (ref 31.5–35.7)
MCV: 96 fL (ref 79–97)
Monocytes Absolute: 0.6 x10E3/uL (ref 0.1–0.9)
Monocytes: 8 %
Neutrophils Absolute: 3.8 x10E3/uL (ref 1.4–7.0)
Neutrophils: 54 %
Platelets: 315 x10E3/uL (ref 150–450)
RBC: 4.51 x10E6/uL (ref 3.77–5.28)
RDW: 12.7 % (ref 11.7–15.4)
WBC: 7 x10E3/uL (ref 3.4–10.8)

## 2024-04-25 LAB — CMP14+EGFR
ALT: 21 IU/L (ref 0–32)
AST: 21 IU/L (ref 0–40)
Albumin: 4.7 g/dL (ref 3.9–4.9)
Alkaline Phosphatase: 134 IU/L (ref 49–135)
BUN/Creatinine Ratio: 21 (ref 12–28)
BUN: 16 mg/dL (ref 8–27)
Bilirubin Total: 0.4 mg/dL (ref 0.0–1.2)
CO2: 22 mmol/L (ref 20–29)
Calcium: 10.1 mg/dL (ref 8.7–10.3)
Chloride: 104 mmol/L (ref 96–106)
Creatinine, Ser: 0.75 mg/dL (ref 0.57–1.00)
Globulin, Total: 2.4 g/dL (ref 1.5–4.5)
Glucose: 87 mg/dL (ref 70–99)
Potassium: 4.7 mmol/L (ref 3.5–5.2)
Sodium: 142 mmol/L (ref 134–144)
Total Protein: 7.1 g/dL (ref 6.0–8.5)
eGFR: 86 mL/min/1.73 (ref >59)

## 2024-04-25 LAB — LIPID PANEL
Chol/HDL Ratio: 3.1 ratio (ref 0.0–4.4)
Cholesterol, Total: 222 mg/dL — ABNORMAL HIGH (ref 100–199)
HDL: 71 mg/dL (ref >39)
LDL Chol Calc (NIH): 133 mg/dL — ABNORMAL HIGH (ref 0–99)
Triglycerides: 100 mg/dL (ref 0–149)
VLDL Cholesterol Cal: 18 mg/dL (ref 5–40)

## 2024-04-25 LAB — VITAMIN D 25 HYDROXY (VIT D DEFICIENCY, FRACTURES): Vit D, 25-Hydroxy: 27.6 ng/mL — AB (ref 30.0–100.0)

## 2024-04-25 LAB — TSH: TSH: 1.42 u[IU]/mL (ref 0.450–4.500)

## 2024-05-01 ENCOUNTER — Ambulatory Visit: Payer: Self-pay | Admitting: Nurse Practitioner

## 2024-05-02 ENCOUNTER — Telehealth: Payer: Self-pay

## 2024-05-02 NOTE — Telephone Encounter (Signed)
 Copied from CRM [id number]. Topic: Clinical - Lab/Test Results >> May 02, 2024  4:44 PM [place] J wrote: Reason for CRM: Patient is calling for lab results. I was able to successfully relay. Patient would like a phone call to go over her lab values. If her cholesterol values are very high then she would like to go with trying a medication to keep it down, but if it's at a manageable level, she would like to try a more natural approach. She's also confused on what kind of Vitamin D  she is supposed to take and for how long.  Patient is aware of the next day turn around time and wanted to let the clinic know that since she is working throughout the day, it is okay to call her niece to provide the information.

## 2024-05-08 ENCOUNTER — Telehealth: Payer: Self-pay

## 2024-05-08 NOTE — Telephone Encounter (Signed)
 Labs translated to Spanish and I will mail them to pt.    Copied from CRM [id number]. Topic: General - Other >> May 08, 2024  8:53 AM [place] MATSU wrote: Reason for CRM: patient called back  regarding missed call from Khaleb Broz.   Per CAL he is with a patient right now.   I advised caller of the above. Per patient she would like a call back with an interpreter.   May you please assist. >> May 08, 2024  9:02 AM [place] MATSU wrote: Patient is requesting a call back in the morning as she works in the afternoon.

## 2024-05-10 ENCOUNTER — Ambulatory Visit: Attending: Nurse Practitioner

## 2024-05-10 ENCOUNTER — Telehealth: Payer: Self-pay

## 2024-05-10 ENCOUNTER — Other Ambulatory Visit: Payer: Self-pay

## 2024-05-10 ENCOUNTER — Telehealth: Payer: Self-pay | Admitting: Nurse Practitioner

## 2024-05-10 DIAGNOSIS — R293 Abnormal posture: Secondary | ICD-10-CM | POA: Diagnosis present

## 2024-05-10 DIAGNOSIS — M6281 Muscle weakness (generalized): Secondary | ICD-10-CM | POA: Insufficient documentation

## 2024-05-10 DIAGNOSIS — G8929 Other chronic pain: Secondary | ICD-10-CM | POA: Insufficient documentation

## 2024-05-10 DIAGNOSIS — M546 Pain in thoracic spine: Secondary | ICD-10-CM | POA: Diagnosis present

## 2024-05-10 MED ORDER — VITAMIN D (ERGOCALCIFEROL) 1.25 MG (50000 UNIT) PO CAPS: 50000.0000 [IU] | ORAL_CAPSULE | ORAL | 1 refills | Status: AC

## 2024-05-10 NOTE — Therapy (Signed)
 OUTPATIENT PHYSICAL THERAPY THORACOLUMBAR EVALUATION   Patient Name: Allison Baker MRN: [id number] DOB:1956/03/24, 68 y.o., female Today's Date: 05/11/2024  END OF SESSION:  PT End of Session - 05/10/24 1319     Visit Number 1    Number of Visits 9    Date for Recertification  07/05/24    Authorization Type UHC    PT Start Time 1015    PT Stop Time 1058    PT Time Calculation (min) 43 min    Activity Tolerance Patient tolerated treatment well    Behavior During Therapy [facility] for tasks assessed/performed          Past Medical History:  Diagnosis Date   Acute bilateral low back pain without sciatica 09/10/2022   Dyslipidemia    Palpitation 01/25/2018   Palpitations    Superficial fungal infection of skin 03/22/2023   Thoracic myofascial strain 12/30/2021   Upper respiratory tract infection 11/23/2022   Past Surgical History:  Procedure Laterality Date   FRACTURE SURGERY  08/2013   HAND   ORIF WRIST FRACTURE Right 08/24/2013   Procedure: OPEN REDUCTION INTERNAL FIXATION (ORIF) RIGHT WRIST FRACTURE;  Surgeon: Prentice LELON Pagan, MD;  Location: [facility] OR;  Service: Orthopedics;  Laterality: Right;   TUBAL LIGATION     Patient Active Problem List   Diagnosis Date Noted   Chronic bilateral thoracic back pain 04/24/2024   Situational mixed anxiety and depressive disorder 04/24/2024   Eczema 04/24/2024   Psychophysiological insomnia 04/24/2024   Discoloration of skin of face 08/03/2023   Encounter for annual physical exam 12/14/2022   Reactive airway disease, mild intermittent, uncomplicated 11/23/2022   Axillary lymphadenopathy 11/23/2022   Hemorrhoids 05/02/2022   Paronychia of finger of left hand 05/02/2022   Nasal cavity polyp 12/16/2021   Corn of toe 12/16/2021   Vitamin D  deficiency 04/28/2021   Mild hyperlipidemia 11/10/2017    PCP: Oris Camie BRAVO, NP   REFERRING PROVIDER: Oris Camie BRAVO, NP   REFERRING DIAG: [phone] (ICD-10-CM) - Chronic bilateral thoracic back pain    Rationale for Evaluation and Treatment: Rehabilitation  THERAPY DIAG:  Pain in thoracic spine - Plan: PT plan of care cert/re-cert  Muscle weakness (generalized) - Plan: PT plan of care cert/re-cert  Abnormal posture - Plan: PT plan of care cert/re-cert  ONSET DATE: Chronic  SUBJECTIVE:                                                                                                                                                                                           SUBJECTIVE STATEMENT: Pt presents to PT with chronic roughly 1 year hx of R  sided thoracic pain. Felt pain onset after twisting and lifting something. Denies UE referral of symptoms or N/T. Pain is relieved with stretching and movement. Feels like massage also helps, asks if that is something that can be done here.   PERTINENT HISTORY:  See PMH  PAIN:  Are you having pain?  Yes: NPRS scale: 6/10 Worst: 10/10 Pain location: thoracic spine (R>L) Pain description: stiff, sharp, sore Aggravating factors: lifting, twisting  Relieving factors: movement, exercise  PRECAUTIONS: None  RED FLAGS: None   WEIGHT BEARING RESTRICTIONS: No  FALLS:  Has patient fallen in last 6 months? No  LIVING ENVIRONMENT: Lives with: lives with their family Lives in: House/apartment  OCCUPATION: [employer]  PLOF: Independent  PATIENT GOALS: decrease back pain, improve comfort and function with work and daily activities   NEXT MD VISIT: 07/24/2024  OBJECTIVE:  Note: Objective measures were completed at Evaluation unless otherwise noted.  DIAGNOSTIC FINDINGS:  See imaging   PATIENT SURVEYS:  ODI: 25/50  COGNITION: Overall cognitive status: Within functional limits for tasks assessed     SENSATION: WFL  POSTURE: rounded shoulders, forward head, and increased thoracic kyphosis  PALPATION: No over TTP noted; T4-T8 thoracic hypomobility   THORACIC ROM:   AROM eval  Flexion   Extension   Right lateral  flexion   Left lateral flexion   Right rotation 25%  Left rotation 75%   (Blank rows = not tested)  UPPER EXTREMITY MMT:  MMT Right eval Left eval  Shoulder flexion    Shoulder extension    Shoulder abduction    Shoulder adduction    Shoulder extension    Shoulder internal rotation    Shoulder external rotation    Middle trapezius 3 3+  Lower trapezius 3 3  Elbow flexion    Elbow extension    Wrist flexion    Wrist extension    Wrist ulnar deviation    Wrist radial deviation    Wrist pronation    Wrist supination    Grip strength     (Blank rows = not tested)   FUNCTIONAL TESTS:  DNT  GAIT: Distance walked: 33ft Assistive device utilized: None Level of assistance: Complete Independence Comments: no overt deviations  TREATMENT: [facility] Adult PT Treatment:                                                DATE: 05/10/2024 Therapeutic Exercise: Prone Y x 5 Cat cow x 5 Seated bilateral ER x 10 RTB Row x 10 blue band  PATIENT EDUCATION:  Education details: eval findings, ODI, HEP, POC Person educated: Patient Education method: Explanation, Demonstration, and Handouts Education comprehension: verbalized understanding and returned demonstration  HOME EXERCISE PROGRAM: Access Code: [email or web address] URL: [email or web address] Date: 05/10/2024 Prepared by: Alm Kingdom  Exercises - Prone Scapular Retraction Y  - 1 x daily - 7 x weekly - 2 sets - 10 reps - Cat Cow  - 1 x daily - 7 x weekly - 2 sets - 10 reps - 5 sec hold - Shoulder External Rotation and Scapular Retraction with Resistance  - 1 x daily - 7 x weekly - 3 sets - 10 reps - rojo hold - Scapular Retraction with Resistance  - 1 x daily - 7 x weekly - 3 sets - 10 reps - azul hold  ASSESSMENT:  CLINICAL IMPRESSION: Patient is  a 68 y.o. F who was seen today for physical therapy evaluation and treatment for chronic thoracic back pain. Physical findings are consistent with referring provider impression as  pt demonstrates decrease in thoracic mobility and ROM as well as overall decrease in postural muscle strength. ODI score shows severe disability in performance of home ADLs and community activities. Pt would benefit from skilled PT services working on improving thoracic mobility and strength in order to decrease pain.    OBJECTIVE IMPAIRMENTS: decreased activity tolerance, decreased mobility, decreased ROM, decreased strength, postural dysfunction, and pain  ACTIVITY LIMITATIONS: carrying, lifting, reach over head, hygiene/grooming, and locomotion level  PARTICIPATION LIMITATIONS: meal prep, cleaning, driving, shopping, community activity, occupation, and yard work  PERSONAL FACTORS: Time since onset of injury/illness/exacerbation are also affecting patient's functional outcome.   REHAB POTENTIAL: Good  CLINICAL DECISION MAKING: Stable/uncomplicated  EVALUATION COMPLEXITY: Low   GOALS: Goals reviewed with patient? No  SHORT TERM GOALS: Target date: 05/31/2024   Pt will be compliant and knowledgeable with initial HEP for improved comfort and carryover Baseline: initial HEP given  Goal status: INITIAL  2.  Pt will self report back pain no greater than 7/10 for improved comfort and functional ability Baseline: 10/10 at worst Goal status: INITIAL   LONG TERM GOALS: Target date: 07/05/2024  Pt will be decrease ODI disability score to no greater than 38% as proxy for functional improvement Baseline: 50% disability  Goal status: INITIAL  2.  Pt will self report back pain no greater than 3/10 for improved comfort and functional ability Baseline: 10/10 at worst Goal status: INITIAL   3.  Pt will improve bilateral thoracic rotation to no less than 75% for improved comfort and functional ability Baseline: see chart Goal status: INITIAL  4.  Pt will improve bilateral middle/lower trap strength to no less than 4/5 for improved comfort and function Baseline: see chart Goal status:  INITIAL   PLAN:  PT FREQUENCY: 1x/week  PT DURATION: 8 weeks  PLANNED INTERVENTIONS: 97164- PT Re-evaluation, 97110-Therapeutic exercises, 97530- Therapeutic activity, [id number]- Neuromuscular re-education, 97535- Self Care, 02859- Manual therapy, G0283- Electrical stimulation (unattended), [id number]- Electrical stimulation (manual), 20560 (1-2 muscles), 20561 (3+ muscles)- Dry Needling, Patient/Family education, Cryotherapy, and Moist heat.  PLAN FOR NEXT SESSION: assess HEP response, massage, periscapular strengthening, thoracic mobility   Alm JAYSON Kingdom, PT 05/11/2024, 9:24 AM

## 2024-05-10 NOTE — Telephone Encounter (Signed)
 Called pt. Had to LM.    Copied from CRM [id number]. Topic: Clinical - Lab/Test Results >> May 10, 2024 11:41 AM Allison Baker wrote: Reason for CRM: Patient calling to discuss her lab results. States the letter she got in the mail is in [place] and she cannot understand it. Requesting a callback from Allison Baker.  Patient can be reached at [phone]

## 2024-05-10 NOTE — Telephone Encounter (Signed)
 Copied from CRM [id number]. Topic: Clinical - Medication Refill >> May 10, 2024 11:35 AM Willma R wrote: Medication: Vitamin D , Ergocalciferol , (DRISDOL ) 1.25 MG (50000 UNIT) CAPS capsule   Has the patient contacted their pharmacy? Yes, call dr  This is the patient's preferred pharmacy:  [facility] [phone] - [place], [place] - [address] AT [facility] [address] [place] [address] Phone: [phone] Fax: [phone]  Is this the correct pharmacy for this prescription? Yes If no, delete pharmacy and type the correct one.   Has the prescription been filled recently? No  Is the patient out of the medication? Yes  Has the patient been seen for an appointment in the last year OR does the patient have an upcoming appointment? Yes  Can we respond through MyChart? No  Agent: Please be advised that Rx refills may take up to 3 business days. We ask that you follow-up with your pharmacy.

## 2024-05-22 ENCOUNTER — Ambulatory Visit

## 2024-05-22 DIAGNOSIS — M546 Pain in thoracic spine: Secondary | ICD-10-CM | POA: Diagnosis not present

## 2024-05-22 DIAGNOSIS — M6281 Muscle weakness (generalized): Secondary | ICD-10-CM

## 2024-05-22 DIAGNOSIS — R293 Abnormal posture: Secondary | ICD-10-CM

## 2024-05-22 NOTE — Therapy (Signed)
 OUTPATIENT PHYSICAL THERAPY TREATMENT   Patient Name: Allison Baker MRN: [id number] DOB:1955/09/15, 68 y.o., female Today's Date: 05/22/2024  END OF SESSION:  PT End of Session - 05/22/24 0906     Visit Number 2    Number of Visits 9    Date for Recertification  07/05/24    Authorization Type UHC    PT Start Time 0930    PT Stop Time 1012    PT Time Calculation (min) 42 min    Activity Tolerance Patient tolerated treatment well    Behavior During Therapy [facility] for tasks assessed/performed           Past Medical History:  Diagnosis Date   Acute bilateral low back pain without sciatica 09/10/2022   Dyslipidemia    Palpitation 01/25/2018   Palpitations    Superficial fungal infection of skin 03/22/2023   Thoracic myofascial strain 12/30/2021   Upper respiratory tract infection 11/23/2022   Past Surgical History:  Procedure Laterality Date   FRACTURE SURGERY  08/2013   HAND   ORIF WRIST FRACTURE Right 08/24/2013   Procedure: OPEN REDUCTION INTERNAL FIXATION (ORIF) RIGHT WRIST FRACTURE;  Surgeon: Prentice LELON Pagan, MD;  Location: [facility] OR;  Service: Orthopedics;  Laterality: Right;   TUBAL LIGATION     Patient Active Problem List   Diagnosis Date Noted   Chronic bilateral thoracic back pain 04/24/2024   Situational mixed anxiety and depressive disorder 04/24/2024   Eczema 04/24/2024   Psychophysiological insomnia 04/24/2024   Discoloration of skin of face 08/03/2023   Encounter for annual physical exam 12/14/2022   Reactive airway disease, mild intermittent, uncomplicated 11/23/2022   Axillary lymphadenopathy 11/23/2022   Hemorrhoids 05/02/2022   Paronychia of finger of left hand 05/02/2022   Nasal cavity polyp 12/16/2021   Corn of toe 12/16/2021   Vitamin D  deficiency 04/28/2021   Mild hyperlipidemia 11/10/2017    PCP: Oris Camie BRAVO, NP   REFERRING PROVIDER: Oris Camie BRAVO, NP   REFERRING DIAG: [phone] (ICD-10-CM) - Chronic bilateral thoracic back pain    Rationale for Evaluation and Treatment: Rehabilitation  THERAPY DIAG:  Pain in thoracic spine  Muscle weakness (generalized)  Abnormal posture  ONSET DATE: Chronic  SUBJECTIVE:                                                                                                                                                                                           SUBJECTIVE STATEMENT: In-Person Interpreter:   Pt presents to PT with reports of continued mid back pain. Does note that it feels a little better than previous week.   EVAL: Pt presents to PT  with chronic roughly 1 year hx of R sided thoracic pain. Felt pain onset after twisting and lifting something. Denies UE referral of symptoms or N/T. Pain is relieved with stretching and movement. Feels like massage also helps, asks if that is something that can be done here.   PERTINENT HISTORY:  See PMH  PAIN:  Are you having pain?  Yes: NPRS scale: 6/10 Worst: 10/10 Pain location: thoracic spine (R>L) Pain description: stiff, sharp, sore Aggravating factors: lifting, twisting  Relieving factors: movement, exercise  PRECAUTIONS: None  RED FLAGS: None   WEIGHT BEARING RESTRICTIONS: No  FALLS:  Has patient fallen in last 6 months? No  LIVING ENVIRONMENT: Lives with: lives with their family Lives in: House/apartment  OCCUPATION: [employer]  PLOF: Independent  PATIENT GOALS: decrease back pain, improve comfort and function with work and daily activities   NEXT MD VISIT: 07/24/2024  OBJECTIVE:  Note: Objective measures were completed at Evaluation unless otherwise noted.  DIAGNOSTIC FINDINGS:  See imaging   PATIENT SURVEYS:  ODI: 25/50  COGNITION: Overall cognitive status: Within functional limits for tasks assessed     SENSATION: WFL  POSTURE: rounded shoulders, forward head, and increased thoracic kyphosis  PALPATION: No over TTP noted; T4-T8 thoracic hypomobility   THORACIC ROM:   AROM eval   Flexion   Extension   Right lateral flexion   Left lateral flexion   Right rotation 25%  Left rotation 75%   (Blank rows = not tested)  UPPER EXTREMITY MMT:  MMT Right eval Left eval  Shoulder flexion    Shoulder extension    Shoulder abduction    Shoulder adduction    Shoulder extension    Shoulder internal rotation    Shoulder external rotation    Middle trapezius 3 3+  Lower trapezius 3 3  Elbow flexion    Elbow extension    Wrist flexion    Wrist extension    Wrist ulnar deviation    Wrist radial deviation    Wrist pronation    Wrist supination    Grip strength     (Blank rows = not tested)   FUNCTIONAL TESTS:  DNT  GAIT: Distance walked: 70ft Assistive device utilized: None Level of assistance: Complete Independence Comments: no overt deviations  TREATMENT: [facility] Adult PT Treatment:                                                DATE: 05/22/2024 Therapeutic Exercise/Activity: Seated bilateral ER 2x15 RTB Seated horizontal abd 2x15 RTB Seated thoracic ext over soft foam x 15 S/L open book x 10 each - 5 hold FM row 2x10 20# Manual Therapy: STM to R sided thoracic paraspinals and rhomboids Grade II PA glides T4-T8  [facility] Adult PT Treatment:                                                DATE: 05/10/2024 Therapeutic Exercise: Prone Y x 5 Cat cow x 5 Seated bilateral ER x 10 RTB Row x 10 blue band  PATIENT EDUCATION:  Education details: eval findings, ODI, HEP, POC Person educated: Patient Education method: Explanation, Demonstration, and Handouts Education comprehension: verbalized understanding and returned demonstration  HOME EXERCISE PROGRAM: Access Code:  [email or web address] URL: [email or web address] Date: 05/22/2024 Prepared by: Alm Kingdom  Exercises - Prone Scapular Retraction Y  - 1 x daily - 7 x weekly - 2 sets - 10 reps - Cat Cow  - 1 x daily - 7 x weekly - 2 sets - 10 reps - 5 sec hold - Shoulder External Rotation and  Scapular Retraction with Resistance  - 1 x daily - 7 x weekly - 3 sets - 10 reps - rojo hold - Scapular Retraction with Resistance  - 1 x daily - 7 x weekly - 3 sets - 10 reps - azul hold - Seated Thoracic Lumbar Extension  - 1 x daily - 7 x weekly - 3 sets - 10 reps - Sidelying Thoracic Rotation with Open Book  - 1 x daily - 7 x weekly - 2 sets - 10 reps - 5 sec hold  ASSESSMENT:  CLINICAL IMPRESSION: Pt was able to complete all prescribed exercises with no adverse effect. She responded well to manual therapy interventions noting decrease in pain and improved ROM. Exercises focused on thoracic mobility and periscapular strengthening in order to reduce pain and improve mobility. HEP updated for thoracic mobility progression. Will continue per POC.   EVAL: Patient is a 68 y.o. F who was seen today for physical therapy evaluation and treatment for chronic thoracic back pain. Physical findings are consistent with referring provider impression as pt demonstrates decrease in thoracic mobility and ROM as well as overall decrease in postural muscle strength. ODI score shows severe disability in performance of home ADLs and community activities. Pt would benefit from skilled PT services working on improving thoracic mobility and strength in order to decrease pain.    OBJECTIVE IMPAIRMENTS: decreased activity tolerance, decreased mobility, decreased ROM, decreased strength, postural dysfunction, and pain  ACTIVITY LIMITATIONS: carrying, lifting, reach over head, hygiene/grooming, and locomotion level  PARTICIPATION LIMITATIONS: meal prep, cleaning, driving, shopping, community activity, occupation, and yard work  PERSONAL FACTORS: Time since onset of injury/illness/exacerbation are also affecting patient's functional outcome.   REHAB POTENTIAL: Good  CLINICAL DECISION MAKING: Stable/uncomplicated  EVALUATION COMPLEXITY: Low   GOALS: Goals reviewed with patient? No  SHORT TERM GOALS: Target date:  05/31/2024   Pt will be compliant and knowledgeable with initial HEP for improved comfort and carryover Baseline: initial HEP given  Goal status: INITIAL  2.  Pt will self report back pain no greater than 7/10 for improved comfort and functional ability Baseline: 10/10 at worst Goal status: INITIAL   LONG TERM GOALS: Target date: 07/05/2024  Pt will be decrease ODI disability score to no greater than 38% as proxy for functional improvement Baseline: 50% disability  Goal status: INITIAL  2.  Pt will self report back pain no greater than 3/10 for improved comfort and functional ability Baseline: 10/10 at worst Goal status: INITIAL   3.  Pt will improve bilateral thoracic rotation to no less than 75% for improved comfort and functional ability Baseline: see chart Goal status: INITIAL  4.  Pt will improve bilateral middle/lower trap strength to no less than 4/5 for improved comfort and function Baseline: see chart Goal status: INITIAL   PLAN:  PT FREQUENCY: 1x/week  PT DURATION: 8 weeks  PLANNED INTERVENTIONS: 97164- PT Re-evaluation, 97110-Therapeutic exercises, 97530- Therapeutic activity, [id number]- Neuromuscular re-education, 97535- Self Care, 02859- Manual therapy, G0283- Electrical stimulation (unattended), [id number]- Electrical stimulation (manual), 20560 (1-2 muscles), 20561 (3+ muscles)- Dry Needling, Patient/Family education, Cryotherapy, and Moist heat.  PLAN FOR NEXT SESSION: assess HEP  response, massage, periscapular strengthening, thoracic mobility   Alm JAYSON Kingdom, PT 05/22/2024, 10:38 AM

## 2024-05-29 ENCOUNTER — Ambulatory Visit

## 2024-05-29 DIAGNOSIS — M546 Pain in thoracic spine: Secondary | ICD-10-CM

## 2024-05-29 DIAGNOSIS — R293 Abnormal posture: Secondary | ICD-10-CM

## 2024-05-29 DIAGNOSIS — M6281 Muscle weakness (generalized): Secondary | ICD-10-CM

## 2024-05-29 NOTE — Therapy (Signed)
 OUTPATIENT PHYSICAL THERAPY TREATMENT   Patient Name: Allison Baker MRN: [id number] DOB:25-Aug-1955, 68 y.o., female Today's Date: 05/29/2024  END OF SESSION:  PT End of Session - 05/29/24 0926     Visit Number 3    Number of Visits 9    Date for Recertification  07/05/24    Authorization Type [employer]    PT Start Time 0930    PT Stop Time 1010    PT Time Calculation (min) 40 min    Activity Tolerance Patient tolerated treatment well    Behavior During Therapy [facility] for tasks assessed/performed            Past Medical History:  Diagnosis Date   Acute bilateral low back pain without sciatica 09/10/2022   Dyslipidemia    Palpitation 01/25/2018   Palpitations    Superficial fungal infection of skin 03/22/2023   Thoracic myofascial strain 12/30/2021   Upper respiratory tract infection 11/23/2022   Past Surgical History:  Procedure Laterality Date   FRACTURE SURGERY  08/2013   HAND   ORIF WRIST FRACTURE Right 08/24/2013   Procedure: OPEN REDUCTION INTERNAL FIXATION (ORIF) RIGHT WRIST FRACTURE;  Surgeon: Prentice LELON Pagan, MD;  Location: [facility] OR;  Service: Orthopedics;  Laterality: Right;   TUBAL LIGATION     Patient Active Problem List   Diagnosis Date Noted   Chronic bilateral thoracic back pain 04/24/2024   Situational mixed anxiety and depressive disorder 04/24/2024   Eczema 04/24/2024   Psychophysiological insomnia 04/24/2024   Discoloration of skin of face 08/03/2023   Encounter for annual physical exam 12/14/2022   Reactive airway disease, mild intermittent, uncomplicated 11/23/2022   Axillary lymphadenopathy 11/23/2022   Hemorrhoids 05/02/2022   Paronychia of finger of left hand 05/02/2022   Nasal cavity polyp 12/16/2021   Corn of toe 12/16/2021   Vitamin D  deficiency 04/28/2021   Mild hyperlipidemia 11/10/2017    PCP: Oris Camie BRAVO, NP   REFERRING PROVIDER: Oris Camie BRAVO, NP   REFERRING DIAG: [phone] (ICD-10-CM) - Chronic bilateral thoracic back pain    Rationale for Evaluation and Treatment: Rehabilitation  THERAPY DIAG:  Pain in thoracic spine  Muscle weakness (generalized)  Abnormal posture  ONSET DATE: Chronic  SUBJECTIVE:                                                                                                                                                                                           SUBJECTIVE STATEMENT: In-Person Interpreter:   Pt presents to PT with reports of decreased mid back pain, but notes that she has new onset of midline LBP.   EVAL: Pt presents to PT  with chronic roughly 1 year hx of R sided thoracic pain. Felt pain onset after twisting and lifting something. Denies UE referral of symptoms or N/T. Pain is relieved with stretching and movement. Feels like massage also helps, asks if that is something that can be done here.   PERTINENT HISTORY:  See PMH  PAIN:  Are you having pain?  Yes: NPRS scale: 6/10 Worst: 10/10 Pain location: thoracic spine (R>L) Pain description: stiff, sharp, sore Aggravating factors: lifting, twisting  Relieving factors: movement, exercise  PRECAUTIONS: None  RED FLAGS: None   WEIGHT BEARING RESTRICTIONS: No  FALLS:  Has patient fallen in last 6 months? No  LIVING ENVIRONMENT: Lives with: lives with their family Lives in: House/apartment  OCCUPATION: [employer]  PLOF: Independent  PATIENT GOALS: decrease back pain, improve comfort and function with work and daily activities   NEXT MD VISIT: 07/24/2024  OBJECTIVE:  Note: Objective measures were completed at Evaluation unless otherwise noted.  DIAGNOSTIC FINDINGS:  See imaging   PATIENT SURVEYS:  ODI: 25/50  COGNITION: Overall cognitive status: Within functional limits for tasks assessed     SENSATION: WFL  POSTURE: rounded shoulders, forward head, and increased thoracic kyphosis  PALPATION: No over TTP noted; T4-T8 thoracic hypomobility   THORACIC ROM:   AROM eval  Flexion    Extension   Right lateral flexion   Left lateral flexion   Right rotation 25%  Left rotation 75%   (Blank rows = not tested)  UPPER EXTREMITY MMT:  MMT Right eval Left eval  Shoulder flexion    Shoulder extension    Shoulder abduction    Shoulder adduction    Shoulder extension    Shoulder internal rotation    Shoulder external rotation    Middle trapezius 3 3+  Lower trapezius 3 3  Elbow flexion    Elbow extension    Wrist flexion    Wrist extension    Wrist ulnar deviation    Wrist radial deviation    Wrist pronation    Wrist supination    Grip strength     (Blank rows = not tested)   FUNCTIONAL TESTS:  DNT  GAIT: Distance walked: 52ft Assistive device utilized: None Level of assistance: Complete Independence Comments: no overt deviations  TREATMENT: [facility] Adult PT Treatment:                                                DATE: 05/29/2024 Seated bilateral ER 2x15 GTB Seated horizontal abd 2x15 RTB FM row 2x10 20# Manual Therapy: STM to R sided thoracic paraspinals and rhomboids Grade II PA glides T4-T8  [facility] Adult PT Treatment:                                                DATE: 05/22/2024 Therapeutic Exercise/Activity: Seated bilateral ER 2x15 RTB Seated horizontal abd 2x15 RTB Seated thoracic ext over soft foam x 15 S/L open book x 10 each - 5 hold FM row 2x10 20# Manual Therapy: STM to R sided thoracic paraspinals and rhomboids Grade II PA glides T4-T8  [facility] Adult PT Treatment:  DATE: 05/10/2024 Therapeutic Exercise: Prone Y x 5 Cat cow x 5 Seated bilateral ER x 10 RTB Row x 10 blue band  PATIENT EDUCATION:  Education details: eval findings, ODI, HEP, POC Person educated: Patient Education method: Explanation, Demonstration, and Handouts Education comprehension: verbalized understanding and returned demonstration  HOME EXERCISE PROGRAM: Access Code: [email or web address] URL:  [email or web address] Date: 05/22/2024 Prepared by: Alm Kingdom  Exercises - Prone Scapular Retraction Y  - 1 x daily - 7 x weekly - 2 sets - 10 reps - Cat Cow  - 1 x daily - 7 x weekly - 2 sets - 10 reps - 5 sec hold - Shoulder External Rotation and Scapular Retraction with Resistance  - 1 x daily - 7 x weekly - 3 sets - 10 reps - rojo hold - Scapular Retraction with Resistance  - 1 x daily - 7 x weekly - 3 sets - 10 reps - azul hold - Seated Thoracic Lumbar Extension  - 1 x daily - 7 x weekly - 3 sets - 10 reps - Sidelying Thoracic Rotation with Open Book  - 1 x daily - 7 x weekly - 2 sets - 10 reps - 5 sec hold  ASSESSMENT:  CLINICAL IMPRESSION: Pt was able to complete all prescribed exercises with no adverse effect. She responded well to manual therapy interventions noting decrease in pain and improved ROM. Exercises focused on thoracic mobility and periscapular strengthening in order to reduce pain and improve mobility. Will continue per POC.   EVAL: Patient is a 68 y.o. F who was seen today for physical therapy evaluation and treatment for chronic thoracic back pain. Physical findings are consistent with referring provider impression as pt demonstrates decrease in thoracic mobility and ROM as well as overall decrease in postural muscle strength. ODI score shows severe disability in performance of home ADLs and community activities. Pt would benefit from skilled PT services working on improving thoracic mobility and strength in order to decrease pain.    OBJECTIVE IMPAIRMENTS: decreased activity tolerance, decreased mobility, decreased ROM, decreased strength, postural dysfunction, and pain  ACTIVITY LIMITATIONS: carrying, lifting, reach over head, hygiene/grooming, and locomotion level  PARTICIPATION LIMITATIONS: meal prep, cleaning, driving, shopping, community activity, occupation, and yard work  PERSONAL FACTORS: Time since onset of injury/illness/exacerbation are  also affecting patient's functional outcome.   REHAB POTENTIAL: Good  CLINICAL DECISION MAKING: Stable/uncomplicated  EVALUATION COMPLEXITY: Low   GOALS: Goals reviewed with patient? No  SHORT TERM GOALS: Target date: 05/31/2024   Pt will be compliant and knowledgeable with initial HEP for improved comfort and carryover Baseline: initial HEP given  Goal status: INITIAL  2.  Pt will self report back pain no greater than 7/10 for improved comfort and functional ability Baseline: 10/10 at worst Goal status: INITIAL   LONG TERM GOALS: Target date: 07/05/2024  Pt will be decrease ODI disability score to no greater than 38% as proxy for functional improvement Baseline: 50% disability  Goal status: INITIAL  2.  Pt will self report back pain no greater than 3/10 for improved comfort and functional ability Baseline: 10/10 at worst Goal status: INITIAL   3.  Pt will improve bilateral thoracic rotation to no less than 75% for improved comfort and functional ability Baseline: see chart Goal status: INITIAL  4.  Pt will improve bilateral middle/lower trap strength to no less than 4/5 for improved comfort and function Baseline: see chart Goal status: INITIAL   PLAN:  PT FREQUENCY: 1x/week  PT DURATION: 8  weeks  PLANNED INTERVENTIONS: 97164- PT Re-evaluation, 97110-Therapeutic exercises, 97530- Therapeutic activity, [id number]- Neuromuscular re-education, 97535- Self Care, 02859- Manual therapy, G0283- Electrical stimulation (unattended), [id number]- Electrical stimulation (manual), 20560 (1-2 muscles), 20561 (3+ muscles)- Dry Needling, Patient/Family education, Cryotherapy, and Moist heat.  PLAN FOR NEXT SESSION: assess HEP response, massage, periscapular strengthening, thoracic mobility   Alm JAYSON Kingdom, PT 05/29/2024, 10:12 AM

## 2024-06-05 ENCOUNTER — Ambulatory Visit: Attending: Nurse Practitioner

## 2024-06-05 DIAGNOSIS — M6281 Muscle weakness (generalized): Secondary | ICD-10-CM | POA: Diagnosis present

## 2024-06-05 DIAGNOSIS — M546 Pain in thoracic spine: Secondary | ICD-10-CM | POA: Diagnosis present

## 2024-06-05 DIAGNOSIS — R293 Abnormal posture: Secondary | ICD-10-CM | POA: Insufficient documentation

## 2024-06-05 NOTE — Therapy (Signed)
 OUTPATIENT PHYSICAL THERAPY TREATMENT   Patient Name: Allison Baker MRN: [id number] DOB:[birth year], 68 y.o., female Today's Date: 06/05/2024  END OF SESSION:  PT End of Session - 06/05/24 0912     Visit Number 4    Number of Visits 9    Date for Recertification  07/05/24    Authorization Type U[employer]    PT Start Time 0925    PT Stop Time 1008    PT Time Calculation (min) 43 min    Activity Tolerance Patient tolerated treatment well    Behavior During Therapy [facility] for tasks assessed/performed             Past Medical History:  Diagnosis Date   Acute bilateral low back pain without sciatica 09/10/2022   Dyslipidemia    Palpitation 01/25/2018   Palpitations    Superficial fungal infection of skin 03/22/2023   Thoracic myofascial strain 12/30/2021   Upper respiratory tract infection 11/23/2022   Past Surgical History:  Procedure Laterality Date   FRACTURE SURGERY  08/2013   HAND   ORIF WRIST FRACTURE Right 08/24/2013   Procedure: OPEN REDUCTION INTERNAL FIXATION (ORIF) RIGHT WRIST FRACTURE;  Surgeon: Prentice LELON Pagan, MD;  Location: [facility] OR;  Service: Orthopedics;  Laterality: Right;   TUBAL LIGATION     Patient Active Problem List   Diagnosis Date Noted   Chronic bilateral thoracic back pain 04/24/2024   Situational mixed anxiety and depressive disorder 04/24/2024   Eczema 04/24/2024   Psychophysiological insomnia 04/24/2024   Discoloration of skin of face 08/03/2023   Encounter for annual physical exam 12/14/2022   Reactive airway disease, mild intermittent, uncomplicated 11/23/2022   Axillary lymphadenopathy 11/23/2022   Hemorrhoids 05/02/2022   Paronychia of finger of left hand 05/02/2022   Nasal cavity polyp 12/16/2021   Corn of toe 12/16/2021   Vitamin D  deficiency 04/28/2021   Mild hyperlipidemia 11/10/2017    PCP: Oris Camie BRAVO, NP   REFERRING PROVIDER: Oris Camie BRAVO, NP   REFERRING DIAG: [phone] (ICD-10-CM) - Chronic bilateral thoracic back pain    Rationale for Evaluation and Treatment: Rehabilitation  THERAPY DIAG:  Pain in thoracic spine  Muscle weakness (generalized)  Abnormal posture  ONSET DATE: Chronic  SUBJECTIVE:                                                                                                                                                                                           SUBJECTIVE STATEMENT: In-Person Interpreter:   Pt presents to PT with reports of discomfort in mid/lower back at 5/10. Feels less muscle tension overall compared to initial session.   EVAL: Pt presents  to PT with chronic roughly 1 year hx of R sided thoracic pain. Felt pain onset after twisting and lifting something. Denies UE referral of symptoms or N/T. Pain is relieved with stretching and movement. Feels like massage also helps, asks if that is something that can be done here.   PERTINENT HISTORY:  See PMH  PAIN:  Are you having pain?  Yes: NPRS scale: 6/10 Worst: 10/10 Pain location: thoracic spine (R>L) Pain description: stiff, sharp, sore Aggravating factors: lifting, twisting  Relieving factors: movement, exercise  PRECAUTIONS: None  RED FLAGS: None   WEIGHT BEARING RESTRICTIONS: No  FALLS:  Has patient fallen in last 6 months? No  LIVING ENVIRONMENT: Lives with: lives with their family Lives in: House/apartment  OCCUPATION: [employer]  PLOF: Independent  PATIENT GOALS: decrease back pain, improve comfort and function with work and daily activities   NEXT MD VISIT: 07/24/2024  OBJECTIVE:  Note: Objective measures were completed at Evaluation unless otherwise noted.  DIAGNOSTIC FINDINGS:  See imaging   PATIENT SURVEYS:  ODI: 25/50  COGNITION: Overall cognitive status: Within functional limits for tasks assessed     SENSATION: WFL  POSTURE: rounded shoulders, forward head, and increased thoracic kyphosis  PALPATION: No over TTP noted; T4-T8 thoracic hypomobility   THORACIC  ROM:   AROM eval  Flexion   Extension   Right lateral flexion   Left lateral flexion   Right rotation 25%  Left rotation 75%   (Blank rows = not tested)  UPPER EXTREMITY MMT:  MMT Right eval Left eval  Shoulder flexion    Shoulder extension    Shoulder abduction    Shoulder adduction    Shoulder extension    Shoulder internal rotation    Shoulder external rotation    Middle trapezius 3 3+  Lower trapezius 3 3  Elbow flexion    Elbow extension    Wrist flexion    Wrist extension    Wrist ulnar deviation    Wrist radial deviation    Wrist pronation    Wrist supination    Grip strength     (Blank rows = not tested)   FUNCTIONAL TESTS:  DNT  GAIT: Distance walked: 52ft Assistive device utilized: None Level of assistance: Complete Independence Comments: no overt deviations  TREATMENT: [facility] Adult PT Treatment:                                                DATE: 06/05/2024 Seated low row 3x8 30#  Standing against wall bilateral ER 2x15 RTB Standing against wall horizontal abd 2x10 RTB Seated thoracic ext over soft foam 2x15 Supine dow flexion for thoracic extension Manual Therapy: STM to R sided thoracic paraspinals and rhomboids Grade II PA glides T4-T8  [facility] Adult PT Treatment:                                                DATE: 05/29/2024 Seated bilateral ER 2x15 GTB Seated horizontal abd 2x15 RTB FM row 2x10 20# Manual Therapy: STM to R sided thoracic paraspinals and rhomboids Grade II PA glides T4-T8  [facility] Adult PT Treatment:  DATE: 05/22/2024 Therapeutic Exercise/Activity: Seated bilateral ER 2x15 RTB Seated horizontal abd 2x15 RTB Seated thoracic ext over soft foam x 15 S/L open book x 10 each - 5 hold FM row 2x10 20# Manual Therapy: STM to R sided thoracic paraspinals and rhomboids Grade II PA glides T4-T8  [facility] Adult PT Treatment:                                                DATE:  05/10/2024 Therapeutic Exercise: Prone Y x 5 Cat cow x 5 Seated bilateral ER x 10 RTB Row x 10 blue band  PATIENT EDUCATION:  Education details: eval findings, ODI, HEP, POC Person educated: Patient Education method: Explanation, Demonstration, and Handouts Education comprehension: verbalized understanding and returned demonstration  HOME EXERCISE PROGRAM: Access Code: [email or web address] URL: [email or web address] Date: 05/22/2024 Prepared by: Alm Kingdom  Exercises - Prone Scapular Retraction Y  - 1 x daily - 7 x weekly - 2 sets - 10 reps - Cat Cow  - 1 x daily - 7 x weekly - 2 sets - 10 reps - 5 sec hold - Shoulder External Rotation and Scapular Retraction with Resistance  - 1 x daily - 7 x weekly - 3 sets - 10 reps - rojo hold - Scapular Retraction with Resistance  - 1 x daily - 7 x weekly - 3 sets - 10 reps - azul hold - Seated Thoracic Lumbar Extension  - 1 x daily - 7 x weekly - 3 sets - 10 reps - Sidelying Thoracic Rotation with Open Book  - 1 x daily - 7 x weekly - 2 sets - 10 reps - 5 sec hold  ASSESSMENT:  CLINICAL IMPRESSION: Pt was able to complete all prescribed exercises with no adverse effect. She responded well to manual therapy interventions noting decrease in pain and improved ROM. Exercises focused on thoracic mobility and periscapular strengthening in order to reduce pain and improve mobility. Will continue per POC.   EVAL: Patient is a 68 y.o. F who was seen today for physical therapy evaluation and treatment for chronic thoracic back pain. Physical findings are consistent with referring provider impression as pt demonstrates decrease in thoracic mobility and ROM as well as overall decrease in postural muscle strength. ODI score shows severe disability in performance of home ADLs and community activities. Pt would benefit from skilled PT services working on improving thoracic mobility and strength in order to decrease pain.    OBJECTIVE IMPAIRMENTS:  decreased activity tolerance, decreased mobility, decreased ROM, decreased strength, postural dysfunction, and pain  ACTIVITY LIMITATIONS: carrying, lifting, reach over head, hygiene/grooming, and locomotion level  PARTICIPATION LIMITATIONS: meal prep, cleaning, driving, shopping, community activity, occupation, and yard work  PERSONAL FACTORS: Time since onset of injury/illness/exacerbation are also affecting patient's functional outcome.   REHAB POTENTIAL: Good  CLINICAL DECISION MAKING: Stable/uncomplicated  EVALUATION COMPLEXITY: Low   GOALS: Goals reviewed with patient? No  SHORT TERM GOALS: Target date: 05/31/2024   Pt will be compliant and knowledgeable with initial HEP for improved comfort and carryover Baseline: initial HEP given  Goal status: MET  2.  Pt will self report back pain no greater than 7/10 for improved comfort and functional ability Baseline: 10/10 at worst 06/05/2024: 5/10 at worst Goal status: MET   LONG TERM GOALS: Target date: 07/05/2024  Pt will be decrease ODI disability score to  no greater than 38% as proxy for functional improvement Baseline: 50% disability  Goal status: INITIAL  2.  Pt will self report back pain no greater than 3/10 for improved comfort and functional ability Baseline: 10/10 at worst Goal status: INITIAL   3.  Pt will improve bilateral thoracic rotation to no less than 75% for improved comfort and functional ability Baseline: see chart Goal status: INITIAL  4.  Pt will improve bilateral middle/lower trap strength to no less than 4/5 for improved comfort and function Baseline: see chart Goal status: INITIAL   PLAN:  PT FREQUENCY: 1x/week  PT DURATION: 8 weeks  PLANNED INTERVENTIONS: 97164- PT Re-evaluation, 97110-Therapeutic exercises, 97530- Therapeutic activity, [id number]- Neuromuscular re-education, 97535- Self Care, 02859- Manual therapy, G0283- Electrical stimulation (unattended), [id number]- Electrical stimulation  (manual), 20560 (1-2 muscles), 20561 (3+ muscles)- Dry Needling, Patient/Family education, Cryotherapy, and Moist heat.  PLAN FOR NEXT SESSION: assess HEP response, massage, periscapular strengthening, thoracic mobility   Alm JAYSON Kingdom, PT 06/05/2024, 10:09 AM

## 2024-06-06 ENCOUNTER — Ambulatory Visit: Admitting: Nurse Practitioner

## 2024-06-12 ENCOUNTER — Ambulatory Visit

## 2024-06-12 DIAGNOSIS — M546 Pain in thoracic spine: Secondary | ICD-10-CM

## 2024-06-12 DIAGNOSIS — M6281 Muscle weakness (generalized): Secondary | ICD-10-CM

## 2024-06-12 DIAGNOSIS — R293 Abnormal posture: Secondary | ICD-10-CM

## 2024-06-12 NOTE — Therapy (Signed)
 OUTPATIENT PHYSICAL THERAPY TREATMENT   Patient Name: Allison Baker MRN: [id number] DOB:11-19-1955, 68 y.o., female Today's Date: 06/13/2024  END OF SESSION:  PT End of Session - 06/12/24 0912     Visit Number 5    Number of Visits 9    Date for Recertification  07/05/24    Authorization Type [employer]    PT Start Time 0930    PT Stop Time 1012    PT Time Calculation (min) 42 min    Activity Tolerance Patient tolerated treatment well    Behavior During Therapy [facility] for tasks assessed/performed              Past Medical History:  Diagnosis Date   Acute bilateral low back pain without sciatica 09/10/2022   Dyslipidemia    Palpitation 01/25/2018   Palpitations    Superficial fungal infection of skin 03/22/2023   Thoracic myofascial strain 12/30/2021   Upper respiratory tract infection 11/23/2022   Past Surgical History:  Procedure Laterality Date   FRACTURE SURGERY  08/2013   HAND   ORIF WRIST FRACTURE Right 08/24/2013   Procedure: OPEN REDUCTION INTERNAL FIXATION (ORIF) RIGHT WRIST FRACTURE;  Surgeon: Allison LELON Pagan, MD;  Location: [facility] OR;  Service: Orthopedics;  Laterality: Right;   TUBAL LIGATION     Patient Active Problem List   Diagnosis Date Noted   Chronic bilateral thoracic back pain 04/24/2024   Situational mixed anxiety and depressive disorder 04/24/2024   Eczema 04/24/2024   Psychophysiological insomnia 04/24/2024   Discoloration of skin of face 08/03/2023   Encounter for annual physical exam 12/14/2022   Reactive airway disease, mild intermittent, uncomplicated 11/23/2022   Axillary lymphadenopathy 11/23/2022   Hemorrhoids 05/02/2022   Paronychia of finger of left hand 05/02/2022   Nasal cavity polyp 12/16/2021   Corn of toe 12/16/2021   Vitamin D  deficiency 04/28/2021   Mild hyperlipidemia 11/10/2017    PCP: Oris Camie BRAVO, NP   REFERRING PROVIDER: Oris Camie BRAVO, NP   REFERRING DIAG: [phone] (ICD-10-CM) - Chronic bilateral thoracic back pain    Rationale for Evaluation and Treatment: Rehabilitation  THERAPY DIAG:  Pain in thoracic spine  Muscle weakness (generalized)  Abnormal posture  ONSET DATE: Chronic  SUBJECTIVE:                                                                                                                                                                                           SUBJECTIVE STATEMENT: In-Person Interpreter:   Pt arrives to PT with reports of continued 5/10 mid back pain. Continues HEP compliance.   EVAL: Pt presents to PT with chronic roughly 1  year hx of R sided thoracic pain. Felt pain onset after twisting and lifting something. Denies UE referral of symptoms or N/T. Pain is relieved with stretching and movement. Feels like massage also helps, asks if that is something that can be done here.   PERTINENT HISTORY:  See PMH  PAIN:  Are you having pain?  Yes: NPRS scale: 6/10 Worst: 10/10 Pain location: thoracic spine (R>L) Pain description: stiff, sharp, sore Aggravating factors: lifting, twisting  Relieving factors: movement, exercise  PRECAUTIONS: None  RED FLAGS: None   WEIGHT BEARING RESTRICTIONS: No  FALLS:  Has patient fallen in last 6 months? No  LIVING ENVIRONMENT: Lives with: lives with their family Lives in: House/apartment  OCCUPATION: [employer]  PLOF: Independent  PATIENT GOALS: decrease back pain, improve comfort and function with work and daily activities   NEXT MD VISIT: 07/24/2024  OBJECTIVE:  Note: Objective measures were completed at Evaluation unless otherwise noted.  DIAGNOSTIC FINDINGS:  See imaging   PATIENT SURVEYS:  ODI: 25/50  COGNITION: Overall cognitive status: Within functional limits for tasks assessed     SENSATION: WFL  POSTURE: rounded shoulders, forward head, and increased thoracic kyphosis  PALPATION: No over TTP noted; T4-T8 thoracic hypomobility   THORACIC ROM:   AROM eval 06/12/24  Flexion     Extension    Right lateral flexion    Left lateral flexion    Right rotation 25% 75%  Left rotation 75%    (Blank rows = not tested)  UPPER EXTREMITY MMT:  MMT Right eval Left eval Right 06/12/24 Left 06/12/24  Shoulder flexion      Shoulder extension      Shoulder abduction      Shoulder adduction      Shoulder extension      Shoulder internal rotation      Shoulder external rotation      Middle trapezius 3 3+ 3+ 3+  Lower trapezius 3 3 3+ 3+  Elbow flexion      Elbow extension      Wrist flexion      Wrist extension      Wrist ulnar deviation      Wrist radial deviation      Wrist pronation      Wrist supination      Grip strength       (Blank rows = not tested)   FUNCTIONAL TESTS:  DNT  GAIT: Distance walked: 31ft Assistive device utilized: None Level of assistance: Complete Independence Comments: no overt deviations  TREATMENT: [facility] Adult PT Treatment:                                                DATE: 06/12/2024 Review of tests/measures, goals, and outcomes Review of HEP Manual Therapy: STM to R sided thoracic paraspinals and rhomboids Grade II PA glides T4-T8  [facility] Adult PT Treatment:                                                DATE: 06/05/2024 Seated low row 3x8 30#  Standing against wall bilateral ER 2x15 RTB Standing against wall horizontal abd 2x10 RTB Seated thoracic ext over soft foam 2x15 Supine dow flexion for thoracic extension Manual Therapy:  STM to R sided thoracic paraspinals and rhomboids Grade II PA glides T4-T8  [facility] Adult PT Treatment:                                                DATE: 05/29/2024 Seated bilateral ER 2x15 GTB Seated horizontal abd 2x15 RTB FM row 2x10 20# Manual Therapy: STM to R sided thoracic paraspinals and rhomboids Grade II PA glides T4-T8  [facility] Adult PT Treatment:                                                DATE: 05/22/2024 Therapeutic Exercise/Activity: Seated bilateral ER 2x15 RTB Seated  horizontal abd 2x15 RTB Seated thoracic ext over soft foam x 15 S/L open book x 10 each - 5 hold FM row 2x10 20# Manual Therapy: STM to R sided thoracic paraspinals and rhomboids Grade II PA glides T4-T8  [facility] Adult PT Treatment:                                                DATE: 05/10/2024 Therapeutic Exercise: Prone Y x 5 Cat cow x 5 Seated bilateral ER x 10 RTB Row x 10 blue band  PATIENT EDUCATION:  Education details: eval findings, ODI, HEP, POC Person educated: Patient Education method: Explanation, Demonstration, and Handouts Education comprehension: verbalized understanding and returned demonstration  HOME EXERCISE PROGRAM: Access Code: [email or web address] URL: [email or web address] Date: 05/22/2024 Prepared by: Alm Kingdom  Exercises - Prone Scapular Retraction Y  - 1 x daily - 7 x weekly - 2 sets - 10 reps - Cat Cow  - 1 x daily - 7 x weekly - 2 sets - 10 reps - 5 sec hold - Shoulder External Rotation and Scapular Retraction with Resistance  - 1 x daily - 7 x weekly - 3 sets - 10 reps - rojo hold - Scapular Retraction with Resistance  - 1 x daily - 7 x weekly - 3 sets - 10 reps - azul hold - Seated Thoracic Lumbar Extension  - 1 x daily - 7 x weekly - 3 sets - 10 reps - Sidelying Thoracic Rotation with Open Book  - 1 x daily - 7 x weekly - 2 sets - 10 reps - 5 sec hold  ASSESSMENT:  CLINICAL IMPRESSION: Pt was responded well to manual therapy interventions noting decrease in pain and improved ROM. Her ODI score has significantly improved, shows slight increase in postural muscle strength. Pt will perform HEP for a few weeks and determine if continuation of skilled PT services is needed.   EVAL: Patient is a 68 y.o. F who was seen today for physical therapy evaluation and treatment for chronic thoracic back pain. Physical findings are consistent with referring provider impression as pt demonstrates decrease in thoracic mobility and ROM as well as overall  decrease in postural muscle strength. ODI score shows severe disability in performance of home ADLs and community activities. Pt would benefit from skilled PT services working on improving thoracic mobility and strength in order to decrease pain.    OBJECTIVE IMPAIRMENTS: decreased activity tolerance, decreased mobility,  decreased ROM, decreased strength, postural dysfunction, and pain  ACTIVITY LIMITATIONS: carrying, lifting, reach over head, hygiene/grooming, and locomotion level  PARTICIPATION LIMITATIONS: meal prep, cleaning, driving, shopping, community activity, occupation, and yard work  PERSONAL FACTORS: Time since onset of injury/illness/exacerbation are also affecting patient's functional outcome.   REHAB POTENTIAL: Good  CLINICAL DECISION MAKING: Stable/uncomplicated  EVALUATION COMPLEXITY: Low   GOALS: Goals reviewed with patient? No  SHORT TERM GOALS: Target date: 05/31/2024   Pt will be compliant and knowledgeable with initial HEP for improved comfort and carryover Baseline: initial HEP given  Goal status: MET  2.  Pt will self report back pain no greater than 7/10 for improved comfort and functional ability Baseline: 10/10 at worst 06/05/2024: 5/10 at worst Goal status: MET   LONG TERM GOALS: Target date: 07/05/2024  Pt will be decrease ODI disability score to no greater than 38% as proxy for functional improvement Baseline: 50% disability 06/12/2024: 0% disability Goal status: MET  2.  Pt will self report back pain no greater than 3/10 for improved comfort and functional ability Baseline: 10/10 at worst 06/12/2024: 4/10 at worst Goal status: IN PROGRESS   3.  Pt will improve bilateral thoracic rotation to no less than 75% for improved comfort and functional ability Baseline: see chart Goal status: MET  4.  Pt will improve bilateral middle/lower trap strength to no less than 4/5 for improved comfort and function Baseline: see chart Goal status: IN  PROGRESS   PLAN:  PT FREQUENCY: 1x/week  PT DURATION: 8 weeks  PLANNED INTERVENTIONS: [id number]- PT Re-evaluation, 97110-Therapeutic exercises, 97530- Therapeutic activity, [id number]- Neuromuscular re-education, 97535- Self Care, [id number]- Manual therapy, [id number]- Electrical stimulation (unattended), [id number]- Electrical stimulation (manual), 20560 (1-2 muscles), 20561 (3+ muscles)- Dry Needling, Patient/Family education, Cryotherapy, and Moist heat.  PLAN FOR NEXT SESSION: assess HEP response, massage, periscapular strengthening, thoracic mobility   Alm JAYSON Kingdom, PT 06/13/2024, 2:58 PM

## 2024-07-24 ENCOUNTER — Encounter: Payer: Self-pay | Admitting: Nurse Practitioner

## 2024-07-24 NOTE — Assessment & Plan Note (Deleted)
 SABRA

## 2024-07-24 NOTE — Progress Notes (Signed)
 Patient not seen.

## 2024-08-01 ENCOUNTER — Encounter: Payer: Self-pay | Admitting: Nurse Practitioner

## 2024-12-05 ENCOUNTER — Other Ambulatory Visit (HOSPITAL_BASED_OUTPATIENT_CLINIC_OR_DEPARTMENT_OTHER)

## 2025-04-24 ENCOUNTER — Encounter: Admitting: Nurse Practitioner

## 2025-04-24 ENCOUNTER — Encounter: Payer: Self-pay | Admitting: Nurse Practitioner
